# Patient Record
Sex: Male | Born: 1960 | Race: Black or African American | Hispanic: No | Marital: Single | Smoking: Never smoker
Health system: Southern US, Community
[De-identification: ages and names within clinical notes are randomized; demographics above are authoritative.]

## PROBLEM LIST (undated history)

## (undated) DIAGNOSIS — I10 Essential (primary) hypertension: Secondary | ICD-10-CM

## (undated) DIAGNOSIS — M109 Gout, unspecified: Secondary | ICD-10-CM

## (undated) DIAGNOSIS — E785 Hyperlipidemia, unspecified: Secondary | ICD-10-CM

## (undated) DIAGNOSIS — E119 Type 2 diabetes mellitus without complications: Secondary | ICD-10-CM

## (undated) HISTORY — PX: NO PAST SURGERIES: SHX2092

---

## 2016-03-12 ENCOUNTER — Emergency Department: Payer: Medicare Other

## 2016-03-12 ENCOUNTER — Encounter: Payer: Self-pay | Admitting: Emergency Medicine

## 2016-03-12 ENCOUNTER — Inpatient Hospital Stay
Admission: EM | Admit: 2016-03-12 | Discharge: 2016-03-16 | DRG: 683 | Disposition: A | Discharge status: Home Health | Payer: Medicare Other | Attending: Internal Medicine | Admitting: Internal Medicine

## 2016-03-12 DIAGNOSIS — R809 Proteinuria, unspecified: Secondary | ICD-10-CM | POA: Diagnosis present

## 2016-03-12 DIAGNOSIS — E860 Dehydration: Secondary | ICD-10-CM | POA: Diagnosis present

## 2016-03-12 DIAGNOSIS — F209 Schizophrenia, unspecified: Secondary | ICD-10-CM | POA: Diagnosis present

## 2016-03-12 DIAGNOSIS — E118 Type 2 diabetes mellitus with unspecified complications: Secondary | ICD-10-CM

## 2016-03-12 DIAGNOSIS — E872 Acidosis: Secondary | ICD-10-CM | POA: Diagnosis present

## 2016-03-12 DIAGNOSIS — Z79899 Other long term (current) drug therapy: Secondary | ICD-10-CM

## 2016-03-12 DIAGNOSIS — E875 Hyperkalemia: Secondary | ICD-10-CM | POA: Diagnosis present

## 2016-03-12 DIAGNOSIS — I10 Essential (primary) hypertension: Secondary | ICD-10-CM | POA: Diagnosis present

## 2016-03-12 DIAGNOSIS — Z6835 Body mass index (BMI) 35.0-35.9, adult: Secondary | ICD-10-CM | POA: Diagnosis not present

## 2016-03-12 DIAGNOSIS — Z8249 Family history of ischemic heart disease and other diseases of the circulatory system: Secondary | ICD-10-CM

## 2016-03-12 DIAGNOSIS — D72829 Elevated white blood cell count, unspecified: Secondary | ICD-10-CM | POA: Diagnosis present

## 2016-03-12 DIAGNOSIS — E119 Type 2 diabetes mellitus without complications: Secondary | ICD-10-CM | POA: Diagnosis not present

## 2016-03-12 DIAGNOSIS — R319 Hematuria, unspecified: Secondary | ICD-10-CM | POA: Diagnosis present

## 2016-03-12 DIAGNOSIS — N179 Acute kidney failure, unspecified: Secondary | ICD-10-CM | POA: Diagnosis not present

## 2016-03-12 DIAGNOSIS — R778 Other specified abnormalities of plasma proteins: Secondary | ICD-10-CM | POA: Diagnosis present

## 2016-03-12 DIAGNOSIS — D472 Monoclonal gammopathy: Secondary | ICD-10-CM | POA: Diagnosis present

## 2016-03-12 DIAGNOSIS — R7989 Other specified abnormal findings of blood chemistry: Secondary | ICD-10-CM

## 2016-03-12 DIAGNOSIS — Z841 Family history of disorders of kidney and ureter: Secondary | ICD-10-CM | POA: Diagnosis not present

## 2016-03-12 DIAGNOSIS — M25569 Pain in unspecified knee: Secondary | ICD-10-CM

## 2016-03-12 DIAGNOSIS — E1140 Type 2 diabetes mellitus with diabetic neuropathy, unspecified: Secondary | ICD-10-CM | POA: Diagnosis present

## 2016-03-12 DIAGNOSIS — M25561 Pain in right knee: Secondary | ICD-10-CM | POA: Diagnosis present

## 2016-03-12 DIAGNOSIS — M109 Gout, unspecified: Secondary | ICD-10-CM | POA: Diagnosis present

## 2016-03-12 DIAGNOSIS — R000 Tachycardia, unspecified: Secondary | ICD-10-CM | POA: Diagnosis present

## 2016-03-12 DIAGNOSIS — Z833 Family history of diabetes mellitus: Secondary | ICD-10-CM

## 2016-03-12 DIAGNOSIS — N19 Unspecified kidney failure: Secondary | ICD-10-CM | POA: Diagnosis not present

## 2016-03-12 DIAGNOSIS — R81 Glycosuria: Secondary | ICD-10-CM | POA: Diagnosis present

## 2016-03-12 DIAGNOSIS — E785 Hyperlipidemia, unspecified: Secondary | ICD-10-CM | POA: Diagnosis present

## 2016-03-12 DIAGNOSIS — Z794 Long term (current) use of insulin: Secondary | ICD-10-CM | POA: Diagnosis not present

## 2016-03-12 DIAGNOSIS — R112 Nausea with vomiting, unspecified: Secondary | ICD-10-CM

## 2016-03-12 HISTORY — DX: Hyperlipidemia, unspecified: E78.5

## 2016-03-12 HISTORY — DX: Essential (primary) hypertension: I10

## 2016-03-12 HISTORY — DX: Morbid (severe) obesity due to excess calories: E66.01

## 2016-03-12 HISTORY — DX: Gout, unspecified: M10.9

## 2016-03-12 HISTORY — DX: Type 2 diabetes mellitus without complications: E11.9

## 2016-03-12 LAB — CBC
HEMATOCRIT: 38 % — AB (ref 40.0–52.0)
Hemoglobin: 12 g/dL — ABNORMAL LOW (ref 13.0–18.0)
MCH: 25.3 pg — AB (ref 26.0–34.0)
MCHC: 31.6 g/dL — ABNORMAL LOW (ref 32.0–36.0)
MCV: 80.1 fL (ref 80.0–100.0)
Platelets: 281 10*3/uL (ref 150–440)
RBC: 4.74 MIL/uL (ref 4.40–5.90)
RDW: 13.8 % (ref 11.5–14.5)
WBC: 16.4 10*3/uL — AB (ref 3.8–10.6)

## 2016-03-12 LAB — COMPREHENSIVE METABOLIC PANEL
ALT: 31 U/L (ref 17–63)
AST: 35 U/L (ref 15–41)
Albumin: 3.7 g/dL (ref 3.5–5.0)
Alkaline Phosphatase: 64 U/L (ref 38–126)
Anion gap: 15 (ref 5–15)
BUN: 84 mg/dL — AB (ref 6–20)
CHLORIDE: 106 mmol/L (ref 101–111)
CO2: 18 mmol/L — AB (ref 22–32)
Calcium: 10.1 mg/dL (ref 8.9–10.3)
Creatinine, Ser: 5.01 mg/dL — ABNORMAL HIGH (ref 0.61–1.24)
GFR calc Af Amer: 14 mL/min — ABNORMAL LOW (ref >60)
GFR, EST NON AFRICAN AMERICAN: 12 mL/min — AB (ref >60)
Glucose, Bld: 200 mg/dL — ABNORMAL HIGH (ref 65–99)
POTASSIUM: 5.3 mmol/L — AB (ref 3.5–5.1)
SODIUM: 139 mmol/L (ref 135–145)
Total Bilirubin: 0.8 mg/dL (ref 0.3–1.2)
Total Protein: 8.7 g/dL — ABNORMAL HIGH (ref 6.5–8.1)

## 2016-03-12 LAB — URINALYSIS COMPLETE WITH MICROSCOPIC (ARMC ONLY)
Bilirubin Urine: NEGATIVE
Glucose, UA: 50 mg/dL — AB
LEUKOCYTES UA: NEGATIVE
Nitrite: NEGATIVE
PH: 5 (ref 5.0–8.0)
Specific Gravity, Urine: 1.01 (ref 1.005–1.030)

## 2016-03-12 LAB — LIPASE, BLOOD: LIPASE: 21 U/L (ref 11–51)

## 2016-03-12 LAB — TROPONIN I: TROPONIN I: 0.03 ng/mL (ref <0.031)

## 2016-03-12 LAB — BRAIN NATRIURETIC PEPTIDE: B Natriuretic Peptide: 54 pg/mL (ref 0.0–100.0)

## 2016-03-12 MED ORDER — ONDANSETRON HCL 4 MG/2ML IJ SOLN
4.0000 mg | Freq: Once | INTRAMUSCULAR | Status: AC
  Administered 2016-03-12: 4 mg via INTRAVENOUS
  Filled 2016-03-12: qty 2

## 2016-03-12 MED ORDER — SODIUM CHLORIDE 0.9 % IV BOLUS (SEPSIS)
1000.0000 mL | Freq: Once | INTRAVENOUS | Status: AC
  Administered 2016-03-12: 1000 mL via INTRAVENOUS

## 2016-03-12 MED ORDER — SODIUM CHLORIDE 0.9 % IV SOLN
INTRAVENOUS | Status: DC
  Administered 2016-03-13 – 2016-03-16 (×6): via INTRAVENOUS

## 2016-03-12 MED ORDER — SODIUM CHLORIDE 0.9 % IV SOLN: 1000.0000 mL | INTRAVENOUS | Status: DC

## 2016-03-12 MED ORDER — SODIUM CHLORIDE 0.9 % IV SOLN
1000.0000 mL | Freq: Once | INTRAVENOUS | Status: DC
Start: 2016-03-12 — End: 2016-03-12

## 2016-03-12 MED ORDER — SODIUM CHLORIDE 0.9 % IV SOLN: 1000.0000 mL | Freq: Once | INTRAVENOUS | Status: DC

## 2016-03-12 MED ORDER — DEXTROSE 5 % IV SOLN
1.0000 g | INTRAVENOUS | Status: DC
  Administered 2016-03-12 – 2016-03-13 (×2): 1 g via INTRAVENOUS
  Filled 2016-03-12 (×3): qty 10

## 2016-03-12 MED ORDER — INSULIN ASPART 100 UNIT/ML ~~LOC~~ SOLN
0.0000 [IU] | Freq: Three times a day (TID) | SUBCUTANEOUS | Status: DC
  Administered 2016-03-13 – 2016-03-14 (×4): 2 [IU] via SUBCUTANEOUS
  Administered 2016-03-14: 3 [IU] via SUBCUTANEOUS
  Administered 2016-03-15: 1 [IU] via SUBCUTANEOUS
  Administered 2016-03-15: 2 [IU] via SUBCUTANEOUS
  Administered 2016-03-15: 3 [IU] via SUBCUTANEOUS
  Filled 2016-03-12: qty 3
  Filled 2016-03-12: qty 1
  Filled 2016-03-12 (×4): qty 2
  Filled 2016-03-12: qty 3
  Filled 2016-03-12: qty 2

## 2016-03-12 MED ORDER — CLONIDINE HCL 0.1 MG PO TABS
0.2000 mg | ORAL_TABLET | Freq: Three times a day (TID) | ORAL | Status: DC
  Administered 2016-03-12 – 2016-03-16 (×11): 0.2 mg via ORAL
  Filled 2016-03-12 (×11): qty 2

## 2016-03-12 NOTE — ED Notes (Signed)
Pt arrived via EMS from home c/o N/V since this morning as well as right knee pain. Pt was seen at [facility] 3 days ago for foot pain and told he had diabetic neuropathy and given a prescription for gabapentin which he did not have filled because he could not afford it. Pt is also c/o of lower back pain today. Reports that lower back pain started after he slipped and fell Friday morning. Was seen at [facility] after falling.

## 2016-03-12 NOTE — ED Notes (Signed)
Patient transported to X-ray 

## 2016-03-12 NOTE — ED Notes (Signed)
Patient will be getting 3 bolus bags of NS and the only IV he has is 22G RAC, which is positional and has poor flow.  Patient states they have had to use [place] on him in the past and I will attempt [place] IV placement when he returns from XR.

## 2016-03-12 NOTE — H&P (Signed)
History and Physical    Caleb Hughes [id number] DOB: [birth year] DOA: 03/12/2016  Referring physician: Dr. Marcelene Butte PCP: [place]  Specialists: none  Chief Complaint: N/V  HPI: Caleb Hughes is a 55 y.o. male has a past medical history significant for DM, HTN, and obesity now ith 2-3 day hx of fever with intractable N/V. Brought to the ER where he was noted to be in ARF with dehydration. Troponin mildly elevated. No CP or SOB. No diarrhea. He is now admitted.  Review of Systems: The patient denies  weight loss,, vision loss, decreased hearing, hoarseness, chest pain, syncope, dyspnea on exertion, peripheral edema, balance deficits, hemoptysis, abdominal pain, melena, hematochezia, severe indigestion/heartburn, hematuria, incontinence, genital sores, muscle weakness, suspicious skin lesions, transient blindness, difficulty walking, depression, unusual weight change, abnormal bleeding, enlarged lymph nodes, angioedema, and breast masses.   Past Medical History  Diagnosis Date  . Diabetes mellitus without complication ([place])   . Hypertension   . Hyperlipemia    History reviewed. No pertinent past surgical history. Social History:  reports that he has never smoked. He does not have any smokeless tobacco history on file. He reports that he does not drink alcohol. His drug history is not on file.  No Known Allergies  Family History  Problem Relation Age of Onset  . Hypertension Mother   . Diabetes type II Mother   . CAD Father     Prior to Admission medications   Not on File   Physical Exam: Filed Vitals:   03/12/16 2009 03/12/16 2030  BP: 156/100 168/99  Pulse: 122 125  Temp: 99.6 F (37.6 C)   TempSrc: Oral   Resp: 19 22  Height: 6\' 2"  (1.88 m)   Weight: 145.151 kg (320 lb)   SpO2: 94% 96%     General:  No apparent distress, WDWN, [place]/AT  Eyes: PERRL, EOMI, no scleral icterus, conjunctiva clear  ENT: dry oropharynx without lesions or exudate, TM's  benign, dentition fair  Neck: supple, no lymphadenopathy. No bruits or thyromegaly  Cardiovascular: rapid rate with regular rhythm without MRG; 2+ peripheral pulses, no JVD, 1+ peripheral edema  Respiratory: CTA biL, good air movement without wheezing, rhonchi or crackled, respiratory effort normal  Abdomen: soft, non tender to palpation, positive bowel sounds, no guarding, no rebound  Skin: no rashes or lesions  Musculoskeletal: normal bulk and tone, no joint swelling  Psychiatric: normal mood and affect, A&OX3  Neurologic: CN 2-12 grossly intact, Motor strength 5/5 in all 4 groups with non-focal sensory exam and symmetric DTR's  Labs on Admission:  Basic Metabolic Panel:  Recent Labs Lab 03/12/16 2015  NA 139  K 5.3*  CL 106  CO2 18*  GLUCOSE 200*  BUN 84*  CREATININE 5.01*  CALCIUM 10.1   Liver Function Tests:  Recent Labs Lab 03/12/16 2015  AST 35  ALT 31  ALKPHOS 64  BILITOT 0.8  PROT 8.7*  ALBUMIN 3.7    Recent Labs Lab 03/12/16 2015  LIPASE 21   No results for input(s): AMMONIA in the last 168 hours. CBC:  Recent Labs Lab 03/12/16 2015  WBC 16.4*  HGB 12.0*  HCT 38.0*  MCV 80.1  PLT 281   Cardiac Enzymes:  Recent Labs Lab 03/12/16 2057  TROPONINI 0.03    BNP (last 3 results)  Recent Labs  03/12/16 2057  BNP 54.0    ProBNP (last 3 results) No results for input(s): PROBNP in the last 8760 hours.  CBG: No results for  input(s): GLUCAP in the last 168 hours.  Radiological Exams on Admission: No results found.  EKG: Independently reviewed.  Assessment/Plan Principal Problem:   ARF (acute renal failure) (HCC) Active Problems:   Dehydration   Intractable nausea and vomiting   Type II diabetes mellitus with manifestations ([place])   Will admit to floor with IV fluids. Consult Nephrology and Cardiology. Telemetry. Empiric IV ABX. Monitor sugars and renal fxn closely. IV Zofran as needed.  Diet: clear liquids Fluids:  NS@100  DVT Prophylaxis: SQ Heparin  Code Status: FULL  Family Communication: yes  Disposition Plan: home  Time spent: 50 min

## 2016-03-12 NOTE — ED Provider Notes (Signed)
Time Seen: Approximately 2100  I have reviewed the triage notes  Chief Complaint: Nausea; Emesis; and Knee Pain   History of Present Illness: Caleb Hughes is a 55 y.o. male who states he's been seen at [facility] recently was diagnosed with peripheral neuropathy. Patient states today he's noticed some increase nausea and has vomited multiple times at home. The patient was not aware of a fever does have some persistent low back pain. He states the pain started after he slipped and fell on Friday morning. He was seen at [facility] also on that occasion his evaluation was negative. Patient denies any loose stool or diarrhea though is felt lightheaded and still feels nauseated. He denies any hematemesis or biliary emesis. He does have a history of type 2 diabetes with insulin therapy states his blood sugars have been running slightly high at home. He did have some brief episode of left-sided chest discomfort after vomiting. He denies any persistent shortness of breath. He states he has been urinating on a frequent basis especially over the last 3 days.   Past Medical History  Diagnosis Date  . Diabetes mellitus without complication ([place])   . Hypertension   . Hyperlipemia     Patient Active Problem List   Diagnosis Date Noted  . ARF (acute renal failure) ([place]) 03/12/2016  . Dehydration 03/12/2016  . Intractable nausea and vomiting 03/12/2016  . Type II diabetes mellitus with manifestations ([place]) 03/12/2016    History reviewed. No pertinent past surgical history.  History reviewed. No pertinent past surgical history.  Current Outpatient Rx  Name  Route  Sig  Dispense  Refill  . amLODipine (NORVASC) 10 MG tablet   Oral   Take 1 tablet by mouth daily.         . chlorthalidone (HYGROTON) 25 MG tablet   Oral   Take 1 tablet by mouth 2 (two) times daily.         . cloNIDine (CATAPRES) 0.2 MG tablet   Oral   Take 1 tablet by mouth 3 (three) times daily.         Marland Kitchen COLCRYS 0.6 MG tablet    Oral   Take 1 tablet by mouth daily.           Dispense as written.   . furosemide (LASIX) 20 MG tablet   Oral   Take 1 tablet by mouth daily.         Marland Kitchen lisinopril (PRINIVIL,ZESTRIL) 10 MG tablet   Oral   Take 1 tablet by mouth daily.         Marland Kitchen NOVOLOG MIX 70/30 (70-30) 100 UNIT/ML injection   Subcutaneous   Inject 70 Units into the skin daily with breakfast.            Dispense as written.   . simvastatin (ZOCOR) 20 MG tablet   Oral   Take 1 tablet by mouth daily.           Allergies:  Review of patient's allergies indicates no known allergies.  Family History: Family History  Problem Relation Age of Onset  . Hypertension Mother   . Diabetes type II Mother   . CAD Father     Social History: Social History  Substance Use Topics  . Smoking status: Never Smoker   . Smokeless tobacco: None  . Alcohol Use: No     Review of Systems:   10 point review of systems was performed and was otherwise negative:  Constitutional: No fever Eyes: No  visual disturbances ENT: No sore throat, ear pain Cardiac: No chest pain Respiratory: No shortness of breath, wheezing, or stridor Abdomen: No abdominal pain, no vomiting, No diarrhea Endocrine: No weight loss, No night sweats Extremities: No peripheral edema, cyanosis Skin: No rashes, easy bruising Neurologic: No focal weakness, trouble with speech or swollowing Urologic: No dysuria, Hematuria,  Pain is located in both lower extremities primarily from the knee distal  Physical Exam:  ED Triage Vitals  Enc Vitals Group     BP 03/12/16 2009 156/100 mmHg     Pulse Rate 03/12/16 2009 122     Resp 03/12/16 2009 19     Temp 03/12/16 2009 99.6 F (37.6 C)     Temp Source 03/12/16 2009 Oral     SpO2 03/12/16 2009 94 %     Weight 03/12/16 2009 320 lb (145.151 kg)     Height 03/12/16 2009 6\' 2"  (1.88 m)     Head Cir --      Peak Flow --      Pain Score 03/12/16 2011 10     Pain Loc --      Pain Edu? --       Excl. in [place]? --     General: Awake , Alert , and Oriented times 3; GCS 15 Head: Normal cephalic , atraumatic Eyes: Pupils equal , round, reactive to light Nose/Throat: No nasal drainage, patent upper airway without erythema or exudate. Dry mucous membranes without ketotic breath Neck: Supple, Full range of motion, No anterior adenopathy or palpable thyroid masses Lungs: Clear to ascultation without wheezes , rhonchi, or rales Heart: Regular rate, regular rhythm without murmurs , gallops , or rubs Abdomen: Soft, non tender without rebound, guarding , or rigidity; bowel sounds positive and symmetric in all 4 quadrants. No organomegaly .        Extremities: Diffuse pain across both lower extremities. No obvious musculoskeletal abnormalities. Neurologic: normal ambulation, Motor symmetric without deficits, sensory intact Skin: warm, dry, no rashes   Labs:   All laboratory work was reviewed including any pertinent negatives or positives listed below:  Labs Reviewed  COMPREHENSIVE METABOLIC PANEL - Abnormal; Notable for the following:    Potassium 5.3 (*)    CO2 18 (*)    Glucose, Bld 200 (*)    BUN 84 (*)    Creatinine, Ser 5.01 (*)    Total Protein 8.7 (*)    GFR calc non Af Amer 12 (*)    GFR calc Af Amer 14 (*)    All other components within normal limits  CBC - Abnormal; Notable for the following:    WBC 16.4 (*)    Hemoglobin 12.0 (*)    HCT 38.0 (*)    MCH 25.3 (*)    MCHC 31.6 (*)    All other components within normal limits  URINALYSIS COMPLETEWITH MICROSCOPIC ([facility] ONLY) - Abnormal; Notable for the following:    Color, Urine YELLOW (*)    APPearance CLEAR (*)    Glucose, UA 50 (*)    Ketones, ur TRACE (*)    Hgb urine dipstick 3+ (*)    Protein, ur >500 (*)    Bacteria, UA RARE (*)    Squamous Epithelial / LPF 0-5 (*)    All other components within normal limits  LIPASE, BLOOD  BRAIN NATRIURETIC PEPTIDE  TROPONIN I  Patient appears to have renal failure based on  his BUN and creatinine levels.  EKG: ED ECG REPORT I, Denice Bors  Marcelene Butte, the attending physician, personally viewed and interpreted this ECG.  Date: 03/12/2016 EKG Time: 2110 Rate: 123 Rhythm: Sinus tachycardia QRS Axis: normal Intervals: normal ST/T Wave abnormalities: normal Conduction Disturbances: none Narrative Interpretation: unremarkable No acute ischemic changes   Radiology:   DG Chest 2 View (Final result) Result time: 03/12/16 22:47:30   Final result by Rad Results In Interface (03/12/16 22:47:30)   Narrative:   CLINICAL DATA: 56 year old male with left-sided chest pain  EXAM: CHEST 2 VIEW  COMPARISON: None.  FINDINGS: The heart size and mediastinal contours are within normal limits. Both lungs are clear. The visualized skeletal structures are unremarkable.  IMPRESSION: No active cardiopulmonary disease.   Electronically Signed By: Anner Crete M.D. On: 03/12/2016 22:47       I personally reviewed the radiologic studies    ED Course: Patient was started on IV fluid resuscitation especially after return of his laboratory findings which show large concern on his kidney function. I asked the patient again whether or not he had any history of renal disease and he states he is not aware of any history of kidney problems. This may be prerenal in nature complicated by his diabetes. Patient had brief left-sided chest discomfort with no ischemic changes on his EKG and thus far troponin is negative. His urine shows some occasional ketones those blood glucose is at 200 and he does not have an anion gap at this point. His bicarbonate is only slightly low at 18 and I felt was unlikely had diabetic ketoacidosis at this time. Treatment will be the same and as fluid resuscitation. Patient's case was reviewed with the hospitalist team, further disposition and management depends upon their evaluation    Assessment:  Renal failure likely  prerenal Insulin-dependent diabetes Peripheral neuropathy Acute unspecified chest pain Persistent nausea and vomiting  Final Clinical Impression:   Final diagnoses:  Renal failure     Plan:  Inpatient management            Daymon Larsen, MD 03/12/16 2350

## 2016-03-13 ENCOUNTER — Inpatient Hospital Stay: Payer: Medicare Other

## 2016-03-13 ENCOUNTER — Inpatient Hospital Stay (HOSPITAL_COMMUNITY)
Admit: 2016-03-13 | Discharge: 2016-03-13 | Disposition: A | Discharge status: Home / Self Care | Payer: Medicare Other | Attending: Nurse Practitioner | Admitting: Nurse Practitioner

## 2016-03-13 DIAGNOSIS — E785 Hyperlipidemia, unspecified: Secondary | ICD-10-CM | POA: Diagnosis present

## 2016-03-13 DIAGNOSIS — E860 Dehydration: Secondary | ICD-10-CM

## 2016-03-13 DIAGNOSIS — I10 Essential (primary) hypertension: Secondary | ICD-10-CM | POA: Diagnosis present

## 2016-03-13 DIAGNOSIS — M109 Gout, unspecified: Secondary | ICD-10-CM | POA: Diagnosis present

## 2016-03-13 DIAGNOSIS — R7989 Other specified abnormal findings of blood chemistry: Secondary | ICD-10-CM

## 2016-03-13 DIAGNOSIS — R778 Other specified abnormalities of plasma proteins: Secondary | ICD-10-CM

## 2016-03-13 DIAGNOSIS — E875 Hyperkalemia: Secondary | ICD-10-CM

## 2016-03-13 LAB — CBC
HEMATOCRIT: 34.2 % — AB (ref 40.0–52.0)
Hemoglobin: 11.1 g/dL — ABNORMAL LOW (ref 13.0–18.0)
MCH: 25.7 pg — ABNORMAL LOW (ref 26.0–34.0)
MCHC: 32.4 g/dL (ref 32.0–36.0)
MCV: 79.4 fL — ABNORMAL LOW (ref 80.0–100.0)
PLATELETS: 256 10*3/uL (ref 150–440)
RBC: 4.31 MIL/uL — ABNORMAL LOW (ref 4.40–5.90)
RDW: 13.6 % (ref 11.5–14.5)
WBC: 13.7 10*3/uL — AB (ref 3.8–10.6)

## 2016-03-13 LAB — PROTEIN / CREATININE RATIO, URINE
CREATININE, URINE: 88 mg/dL
Protein Creatinine Ratio: 3.55 mg/mg{Cre} — ABNORMAL HIGH (ref 0.00–0.15)
Total Protein, Urine: 312 mg/dL

## 2016-03-13 LAB — TSH: TSH: 0.486 u[IU]/mL (ref 0.350–4.500)

## 2016-03-13 LAB — COMPREHENSIVE METABOLIC PANEL
ALBUMIN: 3.2 g/dL — AB (ref 3.5–5.0)
ALK PHOS: 55 U/L (ref 38–126)
ALT: 26 U/L (ref 17–63)
AST: 27 U/L (ref 15–41)
Anion gap: 11 (ref 5–15)
BILIRUBIN TOTAL: 0.5 mg/dL (ref 0.3–1.2)
BUN: 78 mg/dL — AB (ref 6–20)
CALCIUM: 9.4 mg/dL (ref 8.9–10.3)
CO2: 20 mmol/L — ABNORMAL LOW (ref 22–32)
CREATININE: 4.77 mg/dL — AB (ref 0.61–1.24)
Chloride: 109 mmol/L (ref 101–111)
GFR calc Af Amer: 14 mL/min — ABNORMAL LOW (ref >60)
GFR, EST NON AFRICAN AMERICAN: 12 mL/min — AB (ref >60)
GLUCOSE: 203 mg/dL — AB (ref 65–99)
POTASSIUM: 4.9 mmol/L (ref 3.5–5.1)
Sodium: 140 mmol/L (ref 135–145)
TOTAL PROTEIN: 7.7 g/dL (ref 6.5–8.1)

## 2016-03-13 LAB — TROPONIN I
Troponin I: 0.04 ng/mL — ABNORMAL HIGH (ref <0.031)
Troponin I: 0.03 ng/mL (ref <0.031)
Troponin I: 0.03 ng/mL (ref <0.031)

## 2016-03-13 LAB — ECHOCARDIOGRAM COMPLETE
Height: 74 in
Weight: 4472 oz

## 2016-03-13 LAB — GLUCOSE, CAPILLARY
GLUCOSE-CAPILLARY: 177 mg/dL — AB (ref 65–99)
GLUCOSE-CAPILLARY: 199 mg/dL — AB (ref 65–99)
GLUCOSE-CAPILLARY: 251 mg/dL — AB (ref 65–99)
Glucose-Capillary: 114 mg/dL — ABNORMAL HIGH (ref 65–99)

## 2016-03-13 LAB — PHOSPHORUS: Phosphorus: 4.3 mg/dL (ref 2.5–4.6)

## 2016-03-13 LAB — URIC ACID: Uric Acid, Serum: 14.9 mg/dL — ABNORMAL HIGH (ref 4.4–7.6)

## 2016-03-13 LAB — CK: CK TOTAL: 994 U/L — AB (ref 49–397)

## 2016-03-13 MED ORDER — ONDANSETRON HCL 4 MG PO TABS: 4.0000 mg | ORAL_TABLET | Freq: Four times a day (QID) | ORAL | Status: DC | PRN

## 2016-03-13 MED ORDER — ASPIRIN EC 81 MG PO TBEC
81.0000 mg | DELAYED_RELEASE_TABLET | Freq: Every day | ORAL | Status: DC
  Administered 2016-03-13 – 2016-03-16 (×4): 81 mg via ORAL
  Filled 2016-03-13 (×4): qty 1

## 2016-03-13 MED ORDER — HEPARIN SODIUM (PORCINE) 5000 UNIT/ML IJ SOLN
5000.0000 [IU] | Freq: Three times a day (TID) | INTRAMUSCULAR | Status: DC
  Administered 2016-03-13 – 2016-03-16 (×10): 5000 [IU] via SUBCUTANEOUS
  Filled 2016-03-13 (×12): qty 1

## 2016-03-13 MED ORDER — ACETAMINOPHEN 650 MG RE SUPP: 650.0000 mg | Freq: Four times a day (QID) | RECTAL | Status: DC | PRN

## 2016-03-13 MED ORDER — ACETAMINOPHEN 325 MG PO TABS: 650.0000 mg | ORAL_TABLET | Freq: Four times a day (QID) | ORAL | Status: DC | PRN

## 2016-03-13 MED ORDER — SODIUM CHLORIDE 0.9% FLUSH
3.0000 mL | Freq: Two times a day (BID) | INTRAVENOUS | Status: DC
  Administered 2016-03-13 – 2016-03-14 (×3): 3 mL via INTRAVENOUS

## 2016-03-13 MED ORDER — SIMVASTATIN 20 MG PO TABS
20.0000 mg | ORAL_TABLET | Freq: Every day | ORAL | Status: DC
Start: 2016-03-13 — End: 2016-03-14
  Administered 2016-03-13 – 2016-03-14 (×2): 20 mg via ORAL
  Filled 2016-03-13 (×2): qty 1

## 2016-03-13 MED ORDER — AMLODIPINE BESYLATE 10 MG PO TABS
10.0000 mg | ORAL_TABLET | Freq: Every day | ORAL | Status: DC
  Administered 2016-03-13 – 2016-03-16 (×4): 10 mg via ORAL
  Filled 2016-03-13 (×4): qty 1

## 2016-03-13 MED ORDER — HYDROCODONE-ACETAMINOPHEN 5-325 MG PO TABS
1.0000 | ORAL_TABLET | ORAL | Status: DC | PRN
  Administered 2016-03-13: 1 via ORAL
  Administered 2016-03-13 – 2016-03-14 (×2): 2 via ORAL
  Filled 2016-03-13: qty 2
  Filled 2016-03-13: qty 1
  Filled 2016-03-13: qty 2

## 2016-03-13 MED ORDER — PANTOPRAZOLE SODIUM 40 MG IV SOLR
40.0000 mg | Freq: Two times a day (BID) | INTRAVENOUS | Status: DC
  Administered 2016-03-13 – 2016-03-14 (×4): 40 mg via INTRAVENOUS
  Filled 2016-03-13 (×4): qty 40

## 2016-03-13 MED ORDER — ONDANSETRON HCL 4 MG/2ML IJ SOLN
4.0000 mg | Freq: Four times a day (QID) | INTRAMUSCULAR | Status: DC | PRN
  Administered 2016-03-13: 4 mg via INTRAVENOUS
  Filled 2016-03-13 (×2): qty 2

## 2016-03-13 MED ORDER — DOCUSATE SODIUM 100 MG PO CAPS
100.0000 mg | ORAL_CAPSULE | Freq: Two times a day (BID) | ORAL | Status: DC
  Administered 2016-03-13 – 2016-03-15 (×7): 100 mg via ORAL
  Filled 2016-03-13 (×8): qty 1

## 2016-03-13 MED ORDER — METHYLPREDNISOLONE SODIUM SUCC 125 MG IJ SOLR
60.0000 mg | Freq: Once | INTRAMUSCULAR | Status: AC
  Administered 2016-03-13: 60 mg via INTRAVENOUS
  Filled 2016-03-13: qty 2

## 2016-03-13 MED ORDER — SODIUM BICARBONATE 650 MG PO TABS
1300.0000 mg | ORAL_TABLET | Freq: Two times a day (BID) | ORAL | Status: DC
  Administered 2016-03-13 – 2016-03-16 (×7): 1300 mg via ORAL
  Filled 2016-03-13 (×7): qty 2

## 2016-03-13 MED ORDER — LABETALOL HCL 5 MG/ML IV SOLN
20.0000 mg | Freq: Once | INTRAVENOUS | Status: AC
  Administered 2016-03-13: 20 mg via INTRAVENOUS
  Filled 2016-03-13: qty 4

## 2016-03-13 MED ORDER — BISACODYL 10 MG RE SUPP: 10.0000 mg | Freq: Every day | RECTAL | Status: DC | PRN

## 2016-03-13 MED ORDER — LISINOPRIL 10 MG PO TABS
10.0000 mg | ORAL_TABLET | Freq: Every day | ORAL | Status: DC
  Filled 2016-03-13: qty 1

## 2016-03-13 NOTE — Consult Note (Signed)
Date: 03/13/2016                  Patient Name:  Caleb Hughes  MRN: [id number]  DOB: [birth year]  Age / Sex: 55 y.o., male         PCP: [facility]                 Service Requesting Consult: Internal medicine                  Reason for Consult: Acute renal failure             History of Present Illness: Patient is a 55 y.o. male with medical problems of Long-standing diabetes type 2 with neuropathy, hypertension, hyperlipidemia, obesity, schizophrenia, gout who was admitted to [facility] on 03/12/2016 for evaluation of generalized weakness, nausea and vomiting.   Patient reports that until about a few days ago, he was in his usual state of health. He is disabled since 2008 but able to care for himself and Lacinda Axon and clean around his house, sing in the church choir etc. he states that he fell on Friday. He was evaluated at [facility] and discharged home. All weekend, he did not feel well. He reported pain in his lower extremities and was prescribed gabapentin. The cost was $16. He did not pick it up. Yesterday morning, he had nausea and vomiting 1. He decided to come to the hospital for evaluation  Admission labs are significant for severe acute renal failure with creatinine of 5.01/BUN of 84. No prior records are available for comparison. We inquired with primary care but they do not have any recent creatinine levels. Patient reports he was taking ibuprofen at home for neuropathic pain but it did not help him. Nephrology consult has been requested for evaluation  Urinalysis from last night shows protein greater than 500, hemoglobin 3+ on dipstick, glucose 50. RBCs 0-5   Medications: Outpatient medications: Prescriptions prior to admission  Medication Sig Dispense Refill Last Dose  . amLODipine (NORVASC) 10 MG tablet Take 1 tablet by mouth daily.   03/11/2016 at Unknown time  . chlorthalidone (HYGROTON) 25 MG tablet Take 1 tablet by mouth 2 (two) times daily.   03/11/2016 at  Unknown time  . cloNIDine (CATAPRES) 0.2 MG tablet Take 1 tablet by mouth 3 (three) times daily.   03/11/2016 at Unknown time  . COLCRYS 0.6 MG tablet Take 1 tablet by mouth daily.   03/11/2016 at Unknown time  . furosemide (LASIX) 20 MG tablet Take 1 tablet by mouth daily.   03/11/2016 at Unknown time  . lisinopril (PRINIVIL,ZESTRIL) 10 MG tablet Take 1 tablet by mouth daily.   03/11/2016 at Unknown time  . NOVOLOG MIX 70/30 (70-30) 100 UNIT/ML injection Inject 70 Units into the skin daily with breakfast.    03/11/2016 at Unknown time  . simvastatin (ZOCOR) 20 MG tablet Take 1 tablet by mouth daily.   03/11/2016 at Unknown time    Current medications: Current Facility-Administered Medications  Medication Dose Route Frequency Provider Last Rate Last Dose  . 0.9 %  sodium chloride infusion   Intravenous Continuous Idelle Crouch, MD 100 mL/hr at 03/13/16 0219    . acetaminophen (TYLENOL) tablet 650 mg  650 mg Oral Q6H PRN Idelle Crouch, MD       Or  . acetaminophen (TYLENOL) suppository 650 mg  650 mg Rectal Q6H PRN Idelle Crouch, MD      . amLODipine (NORVASC) tablet  10 mg  10 mg Oral Daily Idelle Crouch, MD   10 mg at 03/13/16 [id number]  . aspirin EC tablet 81 mg  81 mg Oral Daily Idelle Crouch, MD   81 mg at 03/13/16 [id number]  . bisacodyl (DULCOLAX) suppository 10 mg  10 mg Rectal Daily PRN Idelle Crouch, MD      . cefTRIAXone (ROCEPHIN) 1 g in dextrose 5 % 50 mL IVPB  1 g Intravenous Q24H Idelle Crouch, MD   Stopped at 03/12/16 2331  . cloNIDine (CATAPRES) tablet 0.2 mg  0.2 mg Oral TID Idelle Crouch, MD   0.2 mg at 03/13/16 0916  . docusate sodium (COLACE) capsule 100 mg  100 mg Oral BID Idelle Crouch, MD   100 mg at 03/13/16 0916  . heparin injection 5,000 Units  5,000 Units Subcutaneous Q8H Idelle Crouch, MD   5,000 Units at 03/13/16 [phone]  . HYDROcodone-acetaminophen (NORCO/VICODIN) 5-325 MG per tablet 1-2 tablet  1-2 tablet Oral Q4H PRN Idelle Crouch, MD   1 tablet at  03/13/16 0133  . insulin aspart (novoLOG) injection 0-9 Units  0-9 Units Subcutaneous TID WC Idelle Crouch, MD   2 Units at 03/13/16 [phone]  . ondansetron (ZOFRAN) tablet 4 mg  4 mg Oral Q6H PRN Idelle Crouch, MD       Or  . ondansetron [facility]) injection 4 mg  4 mg Intravenous Q6H PRN Idelle Crouch, MD   4 mg at 03/13/16 0133  . pantoprazole (PROTONIX) injection 40 mg  40 mg Intravenous Q12H Idelle Crouch, MD   40 mg at 03/13/16 [id number]  . simvastatin (ZOCOR) tablet 20 mg  20 mg Oral Daily Idelle Crouch, MD   20 mg at 03/13/16 0917  . sodium chloride flush (NS) 0.9 % injection 3 mL  3 mL Intravenous Q12H Idelle Crouch, MD   3 mL at 03/13/16 [id number]      Allergies: No Known Allergies    Past Medical History: Past Medical History  Diagnosis Date  . Diabetes mellitus without complication ([place])   . Hypertension   . Hyperlipemia   . Morbid obesity ([place])   . Gout      Past Surgical History: History reviewed. No pertinent past surgical history.   Family History: Family History  Problem Relation Age of Onset  . Hypertension Mother   . Diabetes type II Mother   . CAD Father   . Hypertension Father   . Alcohol abuse Father   . Alcohol abuse Brother   . Alcohol abuse Brother   . Kidney failure Brother   . Kidney failure Brother      Social History: Social History   Social History  . Marital Status: Single    Spouse Name: N/A  . Number of Children: N/A  . Years of Education: N/A   Occupational History  . Not on file.   Social History Main Topics  . Smoking status: Never Smoker   . Smokeless tobacco: Not on file  . Alcohol Use: No  . Drug Use: No  . Sexual Activity: Not on file   Other Topics Concern  . Not on file   Social History Narrative   Lives in [place] with his son.  Disabled/doesn't work.  Does not routinely exercise.     Review of Systems: Gen: Denies weight loss, fevers or chills HEENT: Denies any vision issues or hearing problems CV:  No chest pain or angina. No  previous cardiac history, no leg edema Resp: Denies any acute shortness of breath GI: Denies any problem with appetite, no blood in the stool. Vomiting 1 GU : No problems with voiding. No history of kidney stones. No blood in the urine MS: Chronic back pain, neuropathic pain in the feet Derm:  Denies any acute problems Psych: History of schizophrenia Heme: Denies acute complaints Neuro: Reports neuropathy Endocrine. Long-term diabetic. A1c 6.5 in 09/2015 per chart.  Vital Signs: Blood pressure 131/78, pulse 100, temperature 98.7 F (37.1 C), temperature source Oral, resp. rate 18, height 6\' 2"  (1.88 m), weight 126.78 kg (279 lb 8 oz), SpO2 98 %.   Intake/Output Summary (Last 24 hours) at 03/13/16 1238 Last data filed at 03/13/16 1200  Gross per 24 hour  Intake   1160 ml  Output   2700 ml  Net  -1540 ml    Weight trends: [facility] Weights   03/12/16 2009 03/13/16 0318  Weight: 145.151 kg (320 lb) 126.78 kg (279 lb 8 oz)    Physical Exam: General:  Obese gentleman, laying in the bed   HEENT Anicteric, moist oral mucous membranes   Neck:  Supple   Lungs: Clear to auscultation bilaterally, room air, normal breathing effort   Heart::  Regular rhythm, no rub or gallop   Abdomen: Soft, nontender, nondistended   Extremities:  No peripheral edema   Neurologic: Alert, oriented   Skin: B/l toe onychomycosis             Lab results: Basic Metabolic Panel:  Recent Labs Lab 03/12/16 2015 03/13/16 0419  NA 139 140  K 5.3* 4.9  CL 106 109  CO2 18* 20*  GLUCOSE 200* 203*  BUN 84* 78*  CREATININE 5.01* 4.77*  CALCIUM 10.1 9.4    Liver Function Tests:  Recent Labs Lab 03/13/16 0419  AST 27  ALT 26  ALKPHOS 55  BILITOT 0.5  PROT 7.7  ALBUMIN 3.2*    Recent Labs Lab 03/12/16 2015  LIPASE 21   No results for input(s): AMMONIA in the last 168 hours.  CBC:  Recent Labs Lab 03/12/16 2015 03/13/16 0419  WBC 16.4* 13.7*  HGB  12.0* 11.1*  HCT 38.0* 34.2*  MCV 80.1 79.4*  PLT 281 256    Cardiac Enzymes:  Recent Labs Lab 03/13/16 0419  TROPONINI 0.04*    BNP: Invalid input(s): POCBNP  CBG:  Recent Labs Lab 03/13/16 0722 03/13/16 1137  GLUCAP 177* 114*    Microbiology: No results found for this or any previous visit (from the past 720 hour(s)).   Coagulation Studies: No results for input(s): LABPROT, INR in the last 72 hours.  Urinalysis:  Recent Labs  03/12/16 2057  COLORURINE YELLOW*  LABSPEC 1.010  PHURINE 5.0  GLUCOSEU 50*  HGBUR 3+*  BILIRUBINUR NEGATIVE  KETONESUR TRACE*  PROTEINUR >500*  NITRITE NEGATIVE  LEUKOCYTESUR NEGATIVE        Imaging: Dg Chest 2 View  03/12/2016  CLINICAL DATA:  55 year old male with left-sided chest pain EXAM: CHEST  2 VIEW COMPARISON:  None. FINDINGS: The heart size and mediastinal contours are within normal limits. Both lungs are clear. The visualized skeletal structures are unremarkable. IMPRESSION: No active cardiopulmonary disease. Electronically Signed   By: Anner Crete M.D.   On: 03/12/2016 22:47   Dg Knee 2 Views Right  03/13/2016  CLINICAL DATA:  Right knee pain. States he fell on Friday, but was having pain previous to fall. No surgeries. EXAM: RIGHT KNEE - 3  VIEW COMPARISON:  None. FINDINGS: There is no evidence of fracture, dislocation, or joint effusion. Traction spurs from the patella at the insertion of the quadriceps tendon, and from the tibial tuberosity. There is no evidence of arthropathy or other focal bone abnormality. Soft tissues are unremarkable. IMPRESSION: No acute abnormality. Electronically Signed   By: Lucrezia Europe M.D.   On: 03/13/2016 10:34      Assessment & Plan: Pt is a 55 y.o. yo male with a PMHX of medical problems of Long-standing diabetes type 2 with neuropathy, hypertension, hyperlipidemia, obesity, schizophrenia, gout , was admitted on 03/12/2016 with ARF.   1. Acute renal failure, with proteinuria,  hematuria, glucosuria Baseline creatinine is not known at this time Renal imaging is pending Urinalysis shows proteinuria, hematuria and glucosuria We will obtain a urine protein/creatinine ratio We will also obtain serologies Obtain CK levels Electrolytes and volume status are acceptable No acute indication for dialysis at present  2. Metabolic acidosis Likely secondary to renal failure Will add sodium bicarbonate to his regimen

## 2016-03-13 NOTE — Progress Notes (Signed)
*  PRELIMINARY RESULTS* Echocardiogram 2D Echocardiogram has been performed.  Caleb Hughes 03/13/2016, 2:52 PM

## 2016-03-13 NOTE — Progress Notes (Signed)
Patient ID: Caleb Hughes, male   DOB: [birth year], 55 y.o.   MRN: [id number] [facility] PROGRESS NOTE  Caleb Hughes [id number] DOB: May 20, 1961 DOA: 03/12/2016 PCP: Festus Aloe [facility]  HPI/Subjective: Patient's recently had a fall and went to the hospital in [place]. He was told he had diabetic neuropathy. He's been battling right knee pain going on for a few weeks now and has been taking colcryx and ibuprofen for gout. He still has right knee pain. He states he had a fever of 105 at home. I tried to clarify whether he had a fever of 100.5 and he said it was 105. Here in the hospital he has not that high of a temperature. He also had nausea vomiting and not feeling well.  Objective: Filed Vitals:   03/13/16 0318 03/13/16 0619  BP: 152/89 137/90  Pulse: 106 98  Temp: 98.9 F (37.2 C) 98.9 F (37.2 C)  Resp: 18 18    Filed Weights   03/12/16 2009 03/13/16 0318  Weight: 145.151 kg (320 lb) 126.78 kg (279 lb 8 oz)    ROS: Review of Systems  Constitutional: Negative for fever and chills.  Eyes: Negative for blurred vision.  Respiratory: Positive for shortness of breath. Negative for cough.   Cardiovascular: Negative for chest pain.  Gastrointestinal: Positive for nausea and vomiting. Negative for abdominal pain, diarrhea and constipation.  Genitourinary: Negative for dysuria.  Musculoskeletal: Positive for joint pain.  Neurological: Positive for weakness. Negative for dizziness and headaches.   Exam: Physical Exam  Constitutional: He is oriented to person, place, and time.  HENT:  Nose: No mucosal edema.  Mouth/Throat: No oropharyngeal exudate or posterior oropharyngeal edema.  Eyes: Conjunctivae, EOM and lids are normal. Pupils are equal, round, and reactive to light.  Neck: No JVD present. Carotid bruit is not present. No edema present. No thyroid mass and no thyromegaly present.  Cardiovascular: S1 normal and S2 normal.  Exam reveals no gallop.   No murmur  heard. Pulses:      Dorsalis pedis pulses are 2+ on the right side, and 2+ on the left side.  Respiratory: No respiratory distress. He has no wheezes. He has no rhonchi. He has no rales.  GI: Soft. Bowel sounds are normal. There is no tenderness.  Musculoskeletal:       Right knee: He exhibits decreased range of motion and swelling. Tenderness found. Patellar tendon tenderness noted.       Right ankle: He exhibits swelling.       Left ankle: He exhibits swelling.  Lymphadenopathy:    He has no cervical adenopathy.  Neurological: He is alert and oriented to person, place, and time. No cranial nerve deficit.  Skin: Skin is warm. No rash noted. Nails show no clubbing.  Right knee warm to the touch.  Psychiatric: He has a normal mood and affect.      Data Reviewed: Basic Metabolic Panel:  Recent Labs Lab 03/12/16 2015 03/13/16 0419  NA 139 140  K 5.3* 4.9  CL 106 109  CO2 18* 20*  GLUCOSE 200* 203*  BUN 84* 78*  CREATININE 5.01* 4.77*  CALCIUM 10.1 9.4   Liver Function Tests:  Recent Labs Lab 03/12/16 2015 03/13/16 0419  AST 35 27  ALT 31 26  ALKPHOS 64 55  BILITOT 0.8 0.5  PROT 8.7* 7.7  ALBUMIN 3.7 3.2*    Recent Labs Lab 03/12/16 2015  LIPASE 21   CBC:  Recent Labs Lab 03/12/16 2015 03/13/16 0419  WBC  16.4* 13.7*  HGB 12.0* 11.1*  HCT 38.0* 34.2*  MCV 80.1 79.4*  PLT 281 256   Cardiac Enzymes:  Recent Labs Lab 03/12/16 2057 03/13/16 0419  TROPONINI 0.03 0.04*   BNP (last 3 results)  Recent Labs  03/12/16 2057  BNP 54.0   CBG:  Recent Labs Lab 03/13/16 0722  GLUCAP 177*     Studies: Dg Chest 2 View  03/12/2016  CLINICAL DATA:  55 year old male with left-sided chest pain EXAM: CHEST  2 VIEW COMPARISON:  None. FINDINGS: The heart size and mediastinal contours are within normal limits. Both lungs are clear. The visualized skeletal structures are unremarkable. IMPRESSION: No active cardiopulmonary disease. Electronically Signed    By: Anner Crete M.D.   On: 03/12/2016 22:47    Scheduled Meds: . amLODipine  10 mg Oral Daily  . aspirin EC  81 mg Oral Daily  . cefTRIAXone (ROCEPHIN)  IV  1 g Intravenous Q24H  . cloNIDine  0.2 mg Oral TID  . docusate sodium  100 mg Oral BID  . heparin  5,000 Units Subcutaneous Q8H  . insulin aspart  0-9 Units Subcutaneous TID WC  . methylPREDNISolone (SOLU-MEDROL) injection  60 mg Intravenous Once  . pantoprazole (PROTONIX) IV  40 mg Intravenous Q12H  . simvastatin  20 mg Oral Daily  . sodium chloride flush  3 mL Intravenous Q12H   Continuous Infusions: . sodium chloride 100 mL/hr at 03/13/16 0219    Assessment/Plan:  1. Acute kidney injury. Unknown baseline kidney function. Continue IV fluid hydration. Hold nephrotoxic medication including ibuprofen, Lasix, high [place] and lisinopril. 2. Right knee pain and fever at home. Likely gout but with the fever are not sure. Patient empirically on Rocephin by admitting physician. I will give 1 dose of Solu-Medrol. Check a uric acid. Orthopedic consultation. Get an x-ray of the right knee. 3. Nausea vomiting. This has improved. Advance diet to full liquid and then maybe to solid food later on today. 4. Essential hypertension continue clonidine and Norvasc 5. Hyperlipidemia unspecified continue simvastatin 6. Type 2 diabetes without complication. Sugars will be high with the dose of steroid ear and put on sliding scale. 7. Slight troponin elevation likely false positive with acute renal failure. No chest pain or shortness of breath.  Code Status:     Code Status Orders        Start     Ordered   03/13/16 0312  Full code   Continuous     03/13/16 0312    Code Status History    Date Active Date Inactive Code Status Order ID Comments User Context   This patient has a current code status but no historical code status.     Disposition Plan: To be determined  Consultants:  Nephrology  Orthopedic  surgery  Antibiotics:  Rocephin  Time spent: 25 minutes  Loletha Grayer  [employer]

## 2016-03-13 NOTE — Progress Notes (Signed)
Dr. Estanislado Pandy notified of positive troponin no new orders at this time. Will continue to monitor pt.

## 2016-03-13 NOTE — ED Notes (Signed)
Hospitalist paged due to continuing tachycardia and hypertension after fluids. Order received for IV labetalol.

## 2016-03-13 NOTE — Progress Notes (Signed)
Reorder uric for lab, they had to redraw

## 2016-03-13 NOTE — Consult Note (Signed)
Cardiology Consult    Patient ID: Caleb Hughes MRN: [id number], DOB/AGE: Mar 02, 1961   Admit date: 03/12/2016 Date of Consult: 03/13/2016  Primary Physician: Robertson Primary Cardiologist: New Requesting Provider: R. Wieting, MD  Patient Profile    55 year old male with a history of hypertension, hyperlipidemia, diabetes, and obesity who was admitted with acute renal failure, dehydration, and elevated troponin.  Past Medical History   Past Medical History  Diagnosis Date  . Diabetes mellitus without complication ([place])   . Hypertension   . Hyperlipemia   . Morbid obesity ([place])   . Gout     History reviewed. No pertinent past surgical history.   Allergies  No Known Allergies  History of Present Illness    55 year old male with no prior cardiac history. He does have risk factors including diabetes, hypertension, hyperlipidemia, morbid obesity, and family history of coronary artery disease. He lives locally with his son and notes that he can complete usual activities without experiencing dyspnea on exertion. He has no prior history of chest pain. His diabetes is reasonably well controlled, with a hemoglobin A1c of 6.5 in December 2016 per primary care notes. On Thursday, May 11, he was seen in the [facility] emergency department secondary to bilateral foot pain. He was told this likely represented peripheral neuropathy was prescribed gabapentin. He couldn't afford it and so never picked it up. He continued to have bilateral foot pain over the weekend and subsequently developed right knee pain and swelling. On May 14, she developed nausea and vomiting times 1 in the morning. He remained nauseated the rest the day and also felt feverish. He presented to the [facility] ED late on May 14 and was found to have acute renal failure with an admission creatinine of 5.01. Leukocytosis was also noted with a white count of 16.4. His BNP was normal at 54 while troponin was mildly  elevated at 0.03. Chest x-ray showed no active disease. ECG showed sinus tachycardia at 123 bpm, without acute ST or T changes. He was placed on IV fluids and admitted for further evaluation. Follow-up troponin remains mildly elevated with a flat trend, at 0.04. We have been asked to evaluate. He denies any chest pain or dyspnea prior to admission. Creatinine is slightly better this morning at 4.77.  Inpatient Medications    . amLODipine  10 mg Oral Daily  . aspirin EC  81 mg Oral Daily  . cefTRIAXone (ROCEPHIN)  IV  1 g Intravenous Q24H  . cloNIDine  0.2 mg Oral TID  . docusate sodium  100 mg Oral BID  . heparin  5,000 Units Subcutaneous Q8H  . insulin aspart  0-9 Units Subcutaneous TID WC  . methylPREDNISolone (SOLU-MEDROL) injection  60 mg Intravenous Once  . pantoprazole (PROTONIX) IV  40 mg Intravenous Q12H  . simvastatin  20 mg Oral Daily  . sodium chloride flush  3 mL Intravenous Q12H    Family History    Family History  Problem Relation Age of Onset  . Hypertension Mother   . Diabetes type II Mother   . CAD Father   . Hypertension Father   . Alcohol abuse Father   . Alcohol abuse Brother   . Alcohol abuse Brother   . Kidney failure Brother   . Kidney failure Brother     Social History    Social History   Social History  . Marital Status: Single    Spouse Name: N/A  . Number of Children: N/A  .  Years of Education: N/A   Occupational History  . Not on file.   Social History Main Topics  . Smoking status: Never Smoker   . Smokeless tobacco: Not on file  . Alcohol Use: No  . Drug Use: No  . Sexual Activity: Not on file   Other Topics Concern  . Not on file   Social History Narrative   Lives in [place] with his son.  Disabled/doesn't work.  Does not routinely exercise.     Review of Systems    General:  No chills, +++ fever, no night sweats or weight changes.  Cardiovascular:  No chest pain, dyspnea on exertion, edema, orthopnea, palpitations,  paroxysmal nocturnal dyspnea. Dermatological: No rash, lesions/masses Respiratory: No cough, dyspnea Urologic: No hematuria, dysuria Abdominal:   +++ nausea, vomiting prior to admission, no diarrhea, bright red blood per rectum, melena, or hematemesis Neurologic:  No visual changes, wkns, changes in mental status. All other systems reviewed and are otherwise negative except as noted above.  Physical Exam    Blood pressure 137/90, pulse 98, temperature 98.9 F (37.2 C), temperature source Oral, resp. rate 18, height 6\' 2"  (1.88 m), weight 279 lb 8 oz (126.78 kg), SpO2 100 %.  General: Pleasant, NAD Psych: Normal affect. Neuro: Alert and oriented X 3. Moves all extremities spontaneously. HEENT: Normal  Neck: Supple without bruits. JVP is difficult to gauge secondary to girth. Lungs:  Resp regular and unlabored, diminished breath sounds bilaterally. Heart: RRR no s3, s4, or murmurs -distant heart sounds. Abdomen: Soft, non-tender, non-distended, BS + x 4.  Extremities: No clubbing, cyanosis or edema. DP/PT/Radials 2+ and equal bilaterally. Right knee swelling and tenderness noted.  Labs     Recent Labs  03/12/16 2057 03/13/16 0419  TROPONINI 0.03 0.04*   Lab Results  Component Value Date   WBC 13.7* 03/13/2016   HGB 11.1* 03/13/2016   HCT 34.2* 03/13/2016   MCV 79.4* 03/13/2016   PLT 256 03/13/2016    Recent Labs Lab 03/13/16 0419  NA 140  K 4.9  CL 109  CO2 20*  BUN 78*  CREATININE 4.77*  CALCIUM 9.4  PROT 7.7  BILITOT 0.5  ALKPHOS 55  ALT 26  AST 27  GLUCOSE 203*    Radiology Studies    Dg Chest 2 View  03/12/2016  CLINICAL DATA:  55 year old male with left-sided chest pain EXAM: CHEST  2 VIEW COMPARISON:  None. FINDINGS: The heart size and mediastinal contours are within normal limits. Both lungs are clear. The visualized skeletal structures are unremarkable. IMPRESSION: No active cardiopulmonary disease. Electronically Signed   By: Anner Crete M.D.    On: 03/12/2016 22:47   Dg Knee 2 Views Right  03/13/2016  CLINICAL DATA:  Right knee pain. States he fell on Friday, but was having pain previous to fall. No surgeries. EXAM: RIGHT KNEE - 3 VIEW COMPARISON:  None. FINDINGS: There is no evidence of fracture, dislocation, or joint effusion. Traction spurs from the patella at the insertion of the quadriceps tendon, and from the tibial tuberosity. There is no evidence of arthropathy or other focal bone abnormality. Soft tissues are unremarkable. IMPRESSION: No acute abnormality. Electronically Signed   By: Lucrezia Europe M.D.   On: 03/13/2016 10:34    ECG & Cardiac Imaging    Sinus tachycardia, 123, no acute st/t changes.  Assessment & Plan    1. Acute renal failure: Patient presented with nausea and vomiting on May 14 and finding of acute renal failure  with a creatinine of 5.01. He is unaware of his baseline creatinine but has never been told that he has a prior history of renal failure. He believes that he had labs drawn while in the ED on May 11, however there are no labs in the care everywhere in the ER note makes no mention of labs either. He is being treated for dehydration and creatinine is slightly better this morning at 4.77. Home doses of chlorthalidone, Lasix, lisinopril, and colchicine are on hold. Nephrology following.  2. Elevated troponin: The setting of acute renal failure, nausea, and vomiting, patient has very mild troponin elevation with a flat trend. He was 0.04 this morning. He has no prior history of chest pain or dyspnea. Check echocardiogram to evaluate LV function. If normal, would not pursue any ischemic evaluation at this time. Given risk factors of hypertension, diabetes, obesity, and family history, could pursue outpatient stress testing. Continue aspirin and statin.  3. Leukocytosis:  Patient with a low-grade fever on admission. Chest x-ray nonacute. Urine is normal. X-ray of swollen right knee did not show any acute  abnormality. Antibiotics per internal medicine.  4. Essential hypertension: Blood pressure elevated overnight and improved this morning. Home doses of ACE inhibitor and diuretics on hold. Follow.  5. Hyperlipidemia: Continue statin therapy.  6. Microcytic anemia: Follow.  7. Right knee pain and swelling: Pending steroid injection per internal medicine.  8. Bilateral foot pain: Likely represents peripheral neuropathy. He has a prescription for gabapentin at home, but was recently given to him by the [facility] ED. Defer to internal medicine.  9. Nausea and vomiting: Resolved. Degree of dehydration and renal failures appears to be out of proportion to his nausea and vomiting as he says he only had one episode of vomiting and otherwise had normal by mouth intake over the weekend except for yesterday.  Signed, Murray Hodgkins, NP 03/13/2016, 11:27 AM

## 2016-03-13 NOTE — Progress Notes (Signed)
Pt. Arrived to floor via stretcher, he was able to transfer from stretcher to bed without staff assistance, tele applied, box 10. second box verifier, Caleb Hughes, Therapist, sports. General room orientation given. How to use ascom and call bell system explained. Pt. Alert and oriented. Pain 7/10, medicated in ER prior to transport. Skin is warm and dry. Pt has multiple skin tags/moles scattered across entire body. Second nurse for skin check Caleb Hughes, Therapist, sports. NS at 100. Will continue to monitor pt.

## 2016-03-14 LAB — PROTEIN ELECTROPHORESIS, SERUM
A/G Ratio: 0.8 (ref 0.7–1.7)
ALPHA-2-GLOBULIN: 1.1 g/dL — AB (ref 0.4–1.0)
Albumin ELP: 3 g/dL (ref 2.9–4.4)
Alpha-1-Globulin: 0.5 g/dL — ABNORMAL HIGH (ref 0.0–0.4)
BETA GLOBULIN: 1.1 g/dL (ref 0.7–1.3)
GAMMA GLOBULIN: 1.1 g/dL (ref 0.4–1.8)
GLOBULIN, TOTAL: 3.8 g/dL (ref 2.2–3.9)
M-SPIKE, %: 0.2 g/dL — AB
Total Protein ELP: 6.8 g/dL (ref 6.0–8.5)

## 2016-03-14 LAB — BASIC METABOLIC PANEL
Anion gap: 10 (ref 5–15)
BUN: 79 mg/dL — AB (ref 6–20)
CO2: 20 mmol/L — ABNORMAL LOW (ref 22–32)
CREATININE: 4.38 mg/dL — AB (ref 0.61–1.24)
Calcium: 9.4 mg/dL (ref 8.9–10.3)
Chloride: 107 mmol/L (ref 101–111)
GFR calc Af Amer: 16 mL/min — ABNORMAL LOW (ref >60)
GFR, EST NON AFRICAN AMERICAN: 14 mL/min — AB (ref >60)
GLUCOSE: 212 mg/dL — AB (ref 65–99)
Potassium: 5.2 mmol/L — ABNORMAL HIGH (ref 3.5–5.1)
SODIUM: 137 mmol/L (ref 135–145)

## 2016-03-14 LAB — GLUCOSE, CAPILLARY
GLUCOSE-CAPILLARY: 165 mg/dL — AB (ref 65–99)
Glucose-Capillary: 169 mg/dL — ABNORMAL HIGH (ref 65–99)
Glucose-Capillary: 202 mg/dL — ABNORMAL HIGH (ref 65–99)
Glucose-Capillary: 214 mg/dL — ABNORMAL HIGH (ref 65–99)

## 2016-03-14 LAB — HEPATITIS B CORE ANTIBODY, IGM: HEP B C IGM: NEGATIVE

## 2016-03-14 LAB — HEPATITIS B SURFACE ANTIBODY,QUALITATIVE: HEP B S AB: NONREACTIVE

## 2016-03-14 LAB — ANCA TITERS: C-ANCA: <1:20 {titer}

## 2016-03-14 LAB — PARATHYROID HORMONE, INTACT (NO CA): PTH: 187 pg/mL — AB (ref 15–65)

## 2016-03-14 LAB — HEPATITIS C ANTIBODY (REFLEX)

## 2016-03-14 LAB — HIV ANTIBODY (ROUTINE TESTING W REFLEX): HIV Screen 4th Generation wRfx: NONREACTIVE

## 2016-03-14 LAB — MPO/PR-3 (ANCA) ANTIBODIES
ANCA Proteinase 3: <3.5 U/mL (ref 0.0–3.5)
Myeloperoxidase Abs: <9 U/mL (ref 0.0–9.0)

## 2016-03-14 LAB — ANTINUCLEAR ANTIBODIES, IFA: ANA Ab, IFA: NEGATIVE

## 2016-03-14 LAB — C3 COMPLEMENT: C3 Complement: 202 mg/dL — ABNORMAL HIGH (ref 82–167)

## 2016-03-14 LAB — C4 COMPLEMENT: COMPLEMENT C4, BODY FLUID: 30 mg/dL (ref 14–44)

## 2016-03-14 LAB — HEMOGLOBIN A1C: Hgb A1c MFr Bld: 8.5 % — ABNORMAL HIGH (ref 4.0–6.0)

## 2016-03-14 LAB — HCV COMMENT:

## 2016-03-14 LAB — HEPATITIS B SURFACE ANTIGEN: HEP B S AG: NEGATIVE

## 2016-03-14 MED ORDER — PREDNISONE 20 MG PO TABS
30.0000 mg | ORAL_TABLET | Freq: Every day | ORAL | Status: DC
  Administered 2016-03-14 – 2016-03-16 (×3): 30 mg via ORAL
  Filled 2016-03-14 (×3): qty 1

## 2016-03-14 NOTE — Consult Note (Signed)
ORTHOPAEDIC CONSULTATION  PATIENT NAME: Caleb Hughes DOB: Apr 12, 1961  MRN: [id number]  REQUESTING PHYSICIAN: Loletha Grayer, MD  Chief Complaint: Right knee pain  HPI: Caleb Hughes is a 55 y.o. male who complains of  a several week history of right knee pain. The patient has a history of gout and states that the pain is felt like a mild gouty attack. He noted the relatively sudden onset of right knee pain several weeks ago without any apparent trauma or aggravating event. He hadn't been using a combination of daily colchicine and ibuprofen without any significant improvement of the right knee pain. He occasionally uses a cane for ambulation. Several days ago he sustained a fall although he does not recall specific specifically twisting the right knee or landing on the knee. He has had progressive difficulty with his ambulation and now states that he has been unable to ambulate due to the right knee pain. He has seen some improvement following a dose of steroids yesterday.  He believes he may have had a low-grade fever at home. He denies any significant swelling of the right knee, although has noted the knee to be somewhat warm to the touch. He localizes the discomfort along the anterior aspect of the knee and states that flexion increases the pain.  Of note, upon admission he was noted to have acute renal failure. Uric acid yesterday was elevated at 14.9.  Past Medical History  Diagnosis Date  . Diabetes mellitus without complication ([place])   . Hypertension   . Hyperlipemia   . Morbid obesity ([place])   . Gout    History reviewed. No pertinent past surgical history. Social History   Social History  . Marital Status: Single    Spouse Name: N/A  . Number of Children: N/A  . Years of Education: N/A   Social History Main Topics  . Smoking status: Never Smoker   . Smokeless tobacco: None  . Alcohol Use: No  . Drug Use: No  . Sexual Activity: Not Asked   Other Topics Concern  . None    Social History Narrative   Lives in [place] with his son.  Disabled/doesn't work.  Does not routinely exercise.   Family History  Problem Relation Age of Onset  . Hypertension Mother   . Diabetes type II Mother   . CAD Father   . Hypertension Father   . Alcohol abuse Father   . Alcohol abuse Brother   . Alcohol abuse Brother   . Kidney failure Brother   . Kidney failure Brother    No Known Allergies Prior to Admission medications   Medication Sig Start Date End Date Taking? Authorizing Provider  amLODipine (NORVASC) 10 MG tablet Take 1 tablet by mouth daily. 02/29/16  Yes Historical Provider, MD  chlorthalidone (HYGROTON) 25 MG tablet Take 1 tablet by mouth 2 (two) times daily. 02/29/16  Yes Historical Provider, MD  cloNIDine (CATAPRES) 0.2 MG tablet Take 1 tablet by mouth 3 (three) times daily. 02/29/16  Yes Historical Provider, MD  COLCRYS 0.6 MG tablet Take 1 tablet by mouth daily. 02/29/16  Yes Historical Provider, MD  furosemide (LASIX) 20 MG tablet Take 1 tablet by mouth daily. 02/29/16  Yes Historical Provider, MD  lisinopril (PRINIVIL,ZESTRIL) 10 MG tablet Take 1 tablet by mouth daily. 02/29/16  Yes Historical Provider, MD  NOVOLOG MIX 70/30 (70-30) 100 UNIT/ML injection Inject 70 Units into the skin daily with breakfast.  02/29/16  Yes Historical Provider, MD  simvastatin (ZOCOR) 20 MG tablet  Take 1 tablet by mouth daily. 02/29/16  Yes Historical Provider, MD   Dg Chest 2 View  03/12/2016  CLINICAL DATA:  55 year old male with left-sided chest pain EXAM: CHEST  2 VIEW COMPARISON:  None. FINDINGS: The heart size and mediastinal contours are within normal limits. Both lungs are clear. The visualized skeletal structures are unremarkable. IMPRESSION: No active cardiopulmonary disease. Electronically Signed   By: Anner Crete M.D.   On: 03/12/2016 22:47   US Renal  03/13/2016  CLINICAL DATA:  Acute renal failure EXAM: RENAL / URINARY TRACT ULTRASOUND COMPLETE COMPARISON:  None. FINDINGS:  Right Kidney: Length: 11.0 cm. Mild increased parenchymal echogenicity. No mass or stone. No hydronephrosis. Left Kidney: Length: 11.8 cm. Echogenicity within normal limits. No mass or hydronephrosis visualized. Bladder: Appears normal for degree of bladder distention. IMPRESSION: 1. No acute finding.  No hydronephrosis. 2. Mild increase right renal parenchymal echogenicity suggesting medical renal disease. No other abnormality. Electronically Signed   By: Lajean Manes M.D.   On: 03/13/2016 16:16   Dg Knee 2 Views Right  03/13/2016  CLINICAL DATA:  Right knee pain. States he fell on Friday, but was having pain previous to fall. No surgeries. EXAM: RIGHT KNEE - 3 VIEW COMPARISON:  None. FINDINGS: There is no evidence of fracture, dislocation, or joint effusion. Traction spurs from the patella at the insertion of the quadriceps tendon, and from the tibial tuberosity. There is no evidence of arthropathy or other focal bone abnormality. Soft tissues are unremarkable. IMPRESSION: No acute abnormality. Electronically Signed   By: Lucrezia Europe M.D.   On: 03/13/2016 10:34    Positive ROS: All other systems have been reviewed and were otherwise negative with the exception of those mentioned in the HPI and as above.  Physical Exam: General: Alert and alert in no acute distress. HEENT: Atraumatic and normocephalic. Sclera are clear. Extraocular motion is intact. Oropharynx is clear with moist mucosa. Neck: Supple, nontender, good range of motion. No JVD or carotid bruits. Lungs: Clear to auscultation bilaterally. Cardiovascular: Regular rate and rhythm with normal S1 and S2. No murmurs. No gallops or rubs. Pedal pulses are trace and palpable bilaterally. Homans test is negative bilaterally. No significant pretibial or ankle edema. Abdomen: Soft, nontender, and nondistended. Bowel sounds are present. Skin: No lesions in the area of chief complaint Neurologic: Awake, alert, and oriented. Sensory function is  grossly intact. Motor strength is felt to be 5 over 5 bilaterally. No clonus or tremor. Good motor coordination. Lymphatic: No axillary or cervical lymphadenopathy  MUSCULOSKELETAL: Pertinent musculoskeletal exam is notable for the lower extremities. Hypertrophic toenails are noted bilaterally. There is no gross erythema or evidence of drainage to the feet. No significant swelling of the feet or ankles. No podagra. Good range of motion of the hips and ankles is appreciated.  Examination of the right knee demonstrates some mild soft tissue swelling without gross effusion. The patella is not ballotable. The knee is stable to varus and valgus stress. Lachman's test is negative. No significant medial or lateral joint line tenderness. There is some anterior tenderness to palpation. Slight increased warmth was noted to the right knee compared to the left. The patient demonstrates greater than 90 of flexion.  Assessment: Hyperuricemia, possible gouty flare Right knee pain  Plan: The findings were discussed in detail with the patient. He is actually appreciated some improvement following the steroids yesterday. He was apparently taking just a maintenance dose of colchicine which may not been sufficient to address  an acute gouty flare. It may be worthwhile to ask rheumatology to evaluate the patient for additional treatment options given his acute renal failure.  I have placed an order for a podiatry consult for nail care. He is at high risk for foot/toe complications given his diabetes and diabetic neuropathy.  James P. Holley Bouche M.D.

## 2016-03-14 NOTE — Evaluation (Signed)
Physical Therapy Evaluation Patient Details Name: Caleb Hughes MRN: [id number] DOB: April 03, 1961 Today's Date: 03/14/2016   History of Present Illness  Pt is a 55 y.o. male presenting to hospital with nausea, vomiting, and fever and admitted with acute renal failure.  Pt also with recent h/o fall and h/o R knee pain prior to fall (appears to be gout related pain).  PMH includes DM, htn, obesity.  Clinical Impression  Prior to admission, pt was modified independent ambulating with SPC.  Pt lives with his son in 2nd floor 1 level apt with elevator access.  Currently pt is min to mod assist supine to sit and sit to stand; min assist to ambulate bed to recliner with RW.  Pt demonstrating fatigue and R knee pain limiting activities.  Pt would benefit from skilled PT to address noted impairments and functional limitations.  D/t current assist levels and impaired activity tolerance, recommend pt discharge to STR when medically appropriate.     Follow Up Recommendations SNF    Equipment Recommendations       Recommendations for Other Services       Precautions / Restrictions Precautions Precautions: Fall Restrictions Weight Bearing Restrictions: No      Mobility  Bed Mobility Overal bed mobility: Needs Assistance Bed Mobility: Supine to Sit     Supine to sit: Min assist;Mod assist;HOB elevated     General bed mobility comments: increased effort and time to perform; significant use of side rail  Transfers Overall transfer level: Needs assistance Equipment used: Rolling walker (2 wheeled) Transfers: Sit to/from Stand Sit to Stand: Min assist;Mod assist         General transfer comment: assist to initiate stand and come to full stand; vc's required for hand and feet placement; decreased WB'ing noted through R LE  Ambulation/Gait Ambulation/Gait assistance: Min assist Ambulation Distance (Feet): 3 Feet (bed to recliner) Assistive device: Rolling walker (2 wheeled)   Gait  velocity: decreased   General Gait Details: antalgic, decreased stance time R LE, increased UE support through RW noted  Stairs            Wheelchair Mobility    Modified Rankin (Stroke Patients Only)       Balance Overall balance assessment: Needs assistance;History of Falls Sitting-balance support: Bilateral upper extremity supported;Feet supported Sitting balance-Leahy Scale: Good     Standing balance support: Bilateral upper extremity supported (on RW) Standing balance-Leahy Scale: Fair                               Pertinent Vitals/Pain Pain Assessment: 0-10 Pain Score: 6  Pain Location: R knee Pain Descriptors / Indicators: Sore;Tender Pain Intervention(s): Limited activity within patient's tolerance;Monitored during session;Repositioned  See flow sheet for HR and O2 vitals.    Home Living Family/patient expects to be discharged to:: Private residence Living Arrangements: Children (Pt's son) Available Help at Discharge: Family Type of Home: Apartment Home Access: [place]: One level (2nd floor apt) Home Equipment: Walker - 2 wheels;Cane - single point      Prior Function Level of Independence: Independent with assistive device(s)         Comments: Pt ambulates with SPC and denies any falls in past 6 months other than most recent fall.  Pt takes sink baths and has assist with meals occasionally.     Hand Dominance        Extremity/Trunk Assessment  Upper Extremity Assessment: Generalized weakness           Lower Extremity Assessment: RLE deficits/detail;LLE deficits/detail RLE Deficits / Details: R hip flexion 3+/5; R knee flexion/extension at least 3-/5 (limited ROM d/t pain); R DF at least 4/5; R knee extension close to neutral; R knee flexion 60 degrees (limited d/t pain) LLE Deficits / Details: L LE strength 4-/5 overall hip flexion, knee flexion/extension, and DF  Cervical / Trunk Assessment: Normal   Communication   Communication: No difficulties  Cognition Arousal/Alertness: Awake/alert Behavior During Therapy: WFL for tasks assessed/performed Overall Cognitive Status: Within Functional Limits for tasks assessed                      General Comments General comments (skin integrity, edema, etc.): R knee warm to touch compared to L knee  Nursing cleared pt for participation in physical therapy.  Pt agreeable to PT session.    Exercises Total Joint Exercises Ankle Circles/Pumps: AROM;Strengthening;Both;10 reps;Supine Quad Sets: AROM;Strengthening;Both;10 reps;Supine Short Arc Quad: Strengthening;Both;10 reps;Supine (AAROM R; AROM L) Heel Slides: Strengthening;Both;10 reps;Supine (AAROM R; AROM L) Hip ABduction/ADduction: Strengthening;Both;10 reps;Supine (AAROM R; AROM L)      Assessment/Plan    PT Assessment Patient needs continued PT services  PT Diagnosis Difficulty walking;Acute pain   PT Problem List Decreased strength;Decreased range of motion;Decreased activity tolerance;Decreased balance;Decreased mobility;Decreased knowledge of use of DME;Decreased knowledge of precautions;Pain  PT Treatment Interventions DME instruction;Gait training;Stair training;Functional mobility training;Therapeutic activities;Therapeutic exercise;Balance training;Patient/family education   PT Goals (Current goals can be found in the Care Plan section) Acute Rehab PT Goals Patient Stated Goal: to have less R knee pain PT Goal Formulation: With patient Time For Goal Achievement: 03/28/16 Potential to Achieve Goals: Fair    Frequency Min 2X/week   Barriers to discharge Decreased caregiver support      Co-evaluation               End of Session Equipment Utilized During Treatment: Gait belt Activity Tolerance: Patient limited by pain;Patient limited by fatigue Patient left: in chair;with call bell/phone within reach;with chair alarm set Nurse Communication: Mobility  status;Precautions         Time: [id number] PT Time Calculation (min) (ACUTE ONLY): 21 min   Charges:   PT Evaluation $PT Eval Low Complexity: 1 Procedure PT Treatments $Therapeutic Exercise: 8-22 mins   PT G CodesLeitha Bleak 04/04/16, 4:32 PM Leitha Bleak, [place]

## 2016-03-14 NOTE — Progress Notes (Signed)
Patient ID: Caleb Hughes, male   DOB: 1961-07-23, 55 y.o.   MRN: [id number] [facility] PROGRESS NOTE  Lafayette Meech [id number] DOB: 29-Mar-1961 DOA: 03/12/2016 PCP: Festus Aloe [facility]  HPI/Subjective: Patient still having some right knee pain. He states he is urinating well. Nausea vomiting has resolved and is tolerating his diet.  Objective: Filed Vitals:   03/14/16 0942 03/14/16 1129  BP: 142/97 132/68  Pulse: 75 70  Temp: 97.9 F (36.6 C) 97.7 F (36.5 C)  Resp: 18 18    Filed Weights   03/12/16 2009 03/13/16 0318 03/14/16 0422  Weight: 145.151 kg (320 lb) 126.78 kg (279 lb 8 oz) 129.321 kg (285 lb 1.6 oz)    ROS: Review of Systems  Constitutional: Negative for fever and chills.  Eyes: Negative for blurred vision.  Respiratory: Positive for shortness of breath. Negative for cough.   Cardiovascular: Negative for chest pain.  Gastrointestinal: Negative for nausea, vomiting, abdominal pain, diarrhea and constipation.  Genitourinary: Negative for dysuria.  Musculoskeletal: Positive for joint pain.  Neurological: Positive for weakness. Negative for dizziness and headaches.   Exam: Physical Exam  Constitutional: He is oriented to person, place, and time.  HENT:  Nose: No mucosal edema.  Mouth/Throat: No oropharyngeal exudate or posterior oropharyngeal edema.  Eyes: Conjunctivae, EOM and lids are normal. Pupils are equal, round, and reactive to light.  Neck: No JVD present. Carotid bruit is not present. No edema present. No thyroid mass and no thyromegaly present.  Cardiovascular: S1 normal and S2 normal.  Exam reveals no gallop.   No murmur heard. Pulses:      Dorsalis pedis pulses are 2+ on the right side, and 2+ on the left side.  Respiratory: No respiratory distress. He has no wheezes. He has no rhonchi. He has no rales.  GI: Soft. Bowel sounds are normal. There is no tenderness.  Musculoskeletal:       Right knee: He exhibits decreased range of motion  and swelling. Tenderness found. Patellar tendon tenderness noted.       Right ankle: He exhibits swelling.       Left ankle: He exhibits swelling.  Lymphadenopathy:    He has no cervical adenopathy.  Neurological: He is alert and oriented to person, place, and time. No cranial nerve deficit.  Skin: Skin is warm. No rash noted. Nails show no clubbing.  Right knee warm to the touch.  Psychiatric: He has a normal mood and affect.      Data Reviewed: Basic Metabolic Panel:  Recent Labs Lab 03/12/16 2015 03/13/16 0419 03/13/16 1425 03/14/16 0428  NA 139 140  --  137  K 5.3* 4.9  --  5.2*  CL 106 109  --  107  CO2 18* 20*  --  20*  GLUCOSE 200* 203*  --  212*  BUN 84* 78*  --  79*  CREATININE 5.01* 4.77*  --  4.38*  CALCIUM 10.1 9.4  --  9.4  PHOS  --   --  4.3  --    Liver Function Tests:  Recent Labs Lab 03/12/16 2015 03/13/16 0419  AST 35 27  ALT 31 26  ALKPHOS 64 55  BILITOT 0.8 0.5  PROT 8.7* 7.7  ALBUMIN 3.7 3.2*    Recent Labs Lab 03/12/16 2015  LIPASE 21   CBC:  Recent Labs Lab 03/12/16 2015 03/13/16 0419  WBC 16.4* 13.7*  HGB 12.0* 11.1*  HCT 38.0* 34.2*  MCV 80.1 79.4*  PLT 281 256  Cardiac Enzymes:  Recent Labs Lab 03/12/16 2057 03/13/16 0419 03/13/16 1057 03/13/16 1425  CKTOTAL  --   --   --  994*  TROPONINI 0.03 0.04* 0.03 0.03   BNP (last 3 results)  Recent Labs  03/12/16 2057  BNP 54.0   CBG:  Recent Labs Lab 03/13/16 1137 03/13/16 1721 03/13/16 2120 03/14/16 0727 03/14/16 1132  GLUCAP 114* 199* 251* 165* 169*     Studies: Dg Chest 2 View  03/12/2016  CLINICAL DATA:  55 year old male with left-sided chest pain EXAM: CHEST  2 VIEW COMPARISON:  None. FINDINGS: The heart size and mediastinal contours are within normal limits. Both lungs are clear. The visualized skeletal structures are unremarkable. IMPRESSION: No active cardiopulmonary disease. Electronically Signed   By: Anner Crete M.D.   On: 03/12/2016  22:47   US Renal  03/13/2016  CLINICAL DATA:  Acute renal failure EXAM: RENAL / URINARY TRACT ULTRASOUND COMPLETE COMPARISON:  None. FINDINGS: Right Kidney: Length: 11.0 cm. Mild increased parenchymal echogenicity. No mass or stone. No hydronephrosis. Left Kidney: Length: 11.8 cm. Echogenicity within normal limits. No mass or hydronephrosis visualized. Bladder: Appears normal for degree of bladder distention. IMPRESSION: 1. No acute finding.  No hydronephrosis. 2. Mild increase right renal parenchymal echogenicity suggesting medical renal disease. No other abnormality. Electronically Signed   By: Lajean Manes M.D.   On: 03/13/2016 16:16   Dg Knee 2 Views Right  03/13/2016  CLINICAL DATA:  Right knee pain. States he fell on Friday, but was having pain previous to fall. No surgeries. EXAM: RIGHT KNEE - 3 VIEW COMPARISON:  None. FINDINGS: There is no evidence of fracture, dislocation, or joint effusion. Traction spurs from the patella at the insertion of the quadriceps tendon, and from the tibial tuberosity. There is no evidence of arthropathy or other focal bone abnormality. Soft tissues are unremarkable. IMPRESSION: No acute abnormality. Electronically Signed   By: Lucrezia Europe M.D.   On: 03/13/2016 10:34    Scheduled Meds: . amLODipine  10 mg Oral Daily  . aspirin EC  81 mg Oral Daily  . cloNIDine  0.2 mg Oral TID  . docusate sodium  100 mg Oral BID  . heparin  5,000 Units Subcutaneous Q8H  . insulin aspart  0-9 Units Subcutaneous TID WC  . predniSONE  30 mg Oral Q breakfast  . sodium bicarbonate  1,300 mg Oral BID  . sodium chloride flush  3 mL Intravenous Q12H   Continuous Infusions: . sodium chloride 100 mL/hr at 03/14/16 0625    Assessment/Plan:  1. Acute kidney injury. Unknown baseline kidney function. Renal ultrasound showing medical renal disease. Continue IV fluid hydration. Hold nephrotoxic medication including ibuprofen, Lasix, high [place] and lisinopril. May end up needing a  kidney biopsy as outpatient. 2. Right knee pain and fever at home. Likely gout attack with high uric acid. Stop antibiotics since afebrile here. We'll give prednisone taper. Likely will need low-dose allopurinol as outpatient. Awaiting kidney function to get better prior to dosing colchicine 3. Nausea vomiting. Tolerated solid food 4. Essential hypertension continue clonidine and Norvasc 5. Hyperlipidemia unspecified continue simvastatin 6. Type 2 diabetes without complication. Sugars will be high with the dose of steroid ear and put on sliding scale. 7. Slight troponin elevation likely false positive with acute renal failure. No chest pain or shortness of breath.  Code Status:     Code Status Orders        Start     Ordered  03/13/16 0312  Full code   Continuous     03/13/16 0312    Code Status History    Date Active Date Inactive Code Status Order ID Comments User Context   This patient has a current code status but no historical code status.     Disposition Plan: To be determined  Consultants:  Nephrology  Orthopedic surgery  Time spent: 24 minutes  [place], [place]

## 2016-03-14 NOTE — Progress Notes (Signed)
Notified MD of potassium 5.2; MD not concerned, no new orders given at this time. Nursing staff will continue to monitor. Earleen Reaper, RN

## 2016-03-14 NOTE — Progress Notes (Signed)
Subjective:  Overall doing fair Serum creatinine only slightly improved to 4.3 at Urine output 2400 cc Urine protein/creatinine ratio as 3.55 gm Serology studies are pending Patient is able to eat without nausea or vomiting   Objective:  Vital signs in last 24 hours:  Temp:  [97.7 F (36.5 C)-99.2 F (37.3 C)] 97.7 F (36.5 C) (05/16 1129) Pulse Rate:  [70-87] 70 (05/16 1129) Resp:  [16-18] 18 (05/16 1129) BP: (124-142)/(68-97) 132/68 mmHg (05/16 1129) SpO2:  [94 %-99 %] 99 % (05/16 1129) Weight:  [129.321 kg (285 lb 1.6 oz)] 129.321 kg (285 lb 1.6 oz) (05/16 0422)  Weight change: -15.831 kg (-34 lb 14.4 oz) Filed Weights   03/12/16 2009 03/13/16 0318 03/14/16 0422  Weight: 145.151 kg (320 lb) 126.78 kg (279 lb 8 oz) 129.321 kg (285 lb 1.6 oz)    Intake/Output:    Intake/Output Summary (Last 24 hours) at 03/14/16 1354 Last data filed at 03/14/16 1348  Gross per 24 hour  Intake    840 ml  Output   1750 ml  Net   -910 ml     Physical Exam: General: No acute distress, lying in the bed  HEENT Anicteric, moist mucous membranes  Neck supple  Pulm/lungs Normal effort, clear to auscultation bilaterally  CVS/Heart Regular, no rub or gallop  Abdomen:  Soft, nontender  Extremities: No peripheral edema, right knee swelling  Neurologic: Alert, oriented  Skin: No acute rashes          Basic Metabolic Panel:   Recent Labs Lab 03/12/16 2015 03/13/16 0419 03/13/16 1425 03/14/16 0428  NA 139 140  --  137  K 5.3* 4.9  --  5.2*  CL 106 109  --  107  CO2 18* 20*  --  20*  GLUCOSE 200* 203*  --  212*  BUN 84* 78*  --  79*  CREATININE 5.01* 4.77*  --  4.38*  CALCIUM 10.1 9.4  --  9.4  PHOS  --   --  4.3  --      CBC:  Recent Labs Lab 03/12/16 2015 03/13/16 0419  WBC 16.4* 13.7*  HGB 12.0* 11.1*  HCT 38.0* 34.2*  MCV 80.1 79.4*  PLT 281 256      Microbiology:  No results found for this or any previous visit (from the past 720  hour(s)).  Coagulation Studies: No results for input(s): LABPROT, INR in the last 72 hours.  Urinalysis:  Recent Labs  03/12/16 2057  COLORURINE YELLOW*  LABSPEC 1.010  PHURINE 5.0  GLUCOSEU 50*  HGBUR 3+*  BILIRUBINUR NEGATIVE  KETONESUR TRACE*  PROTEINUR >500*  NITRITE NEGATIVE  LEUKOCYTESUR NEGATIVE      Imaging: Dg Chest 2 View  03/12/2016  CLINICAL DATA:  55 year old male with left-sided chest pain EXAM: CHEST  2 VIEW COMPARISON:  None. FINDINGS: The heart size and mediastinal contours are within normal limits. Both lungs are clear. The visualized skeletal structures are unremarkable. IMPRESSION: No active cardiopulmonary disease. Electronically Signed   By: Anner Crete M.D.   On: 03/12/2016 22:47   US Renal  03/13/2016  CLINICAL DATA:  Acute renal failure EXAM: RENAL / URINARY TRACT ULTRASOUND COMPLETE COMPARISON:  None. FINDINGS: Right Kidney: Length: 11.0 cm. Mild increased parenchymal echogenicity. No mass or stone. No hydronephrosis. Left Kidney: Length: 11.8 cm. Echogenicity within normal limits. No mass or hydronephrosis visualized. Bladder: Appears normal for degree of bladder distention. IMPRESSION: 1. No acute finding.  No hydronephrosis. 2. Mild increase right renal  parenchymal echogenicity suggesting medical renal disease. No other abnormality. Electronically Signed   By: Lajean Manes M.D.   On: 03/13/2016 16:16   Dg Knee 2 Views Right  03/13/2016  CLINICAL DATA:  Right knee pain. States he fell on Friday, but was having pain previous to fall. No surgeries. EXAM: RIGHT KNEE - 3 VIEW COMPARISON:  None. FINDINGS: There is no evidence of fracture, dislocation, or joint effusion. Traction spurs from the patella at the insertion of the quadriceps tendon, and from the tibial tuberosity. There is no evidence of arthropathy or other focal bone abnormality. Soft tissues are unremarkable. IMPRESSION: No acute abnormality. Electronically Signed   By: Lucrezia Europe M.D.   On:  03/13/2016 10:34     Medications:   . sodium chloride 100 mL/hr at 03/14/16 0625   . amLODipine  10 mg Oral Daily  . aspirin EC  81 mg Oral Daily  . cefTRIAXone (ROCEPHIN)  IV  1 g Intravenous Q24H  . cloNIDine  0.2 mg Oral TID  . docusate sodium  100 mg Oral BID  . heparin  5,000 Units Subcutaneous Q8H  . insulin aspart  0-9 Units Subcutaneous TID WC  . predniSONE  30 mg Oral Q breakfast  . sodium bicarbonate  1,300 mg Oral BID  . sodium chloride flush  3 mL Intravenous Q12H   acetaminophen **OR** acetaminophen, bisacodyl, HYDROcodone-acetaminophen, ondansetron **OR** ondansetron (ZOFRAN) IV  Assessment/ Plan:  55 y.o. male male with a PMHX of medical problems of Long-standing diabetes type 2 with neuropathy, hypertension, hyperlipidemia, obesity, schizophrenia, gout , was admitted on 03/12/2016 with ARF.   1. Acute renal failure, with proteinuria, hematuria, glucosuria Baseline creatinine is not known at this time Renal imaging is pending Urinalysis shows proteinuria, hematuria and glucosuria urine protein/creatinine ratio is 3.55 gm Serologies pending CK levels mildly elevated Electrolytes and volume status are acceptable No acute indication for dialysis at present Discussed with patient re: possibiliy of needing kidney biopsy  2. Metabolic acidosis Likely secondary to renal failure Continue sodium bicarbonate   LOS: 2 Caleb Hughes 5/16/20171:54 PM

## 2016-03-14 NOTE — Care Management Important Message (Signed)
Important Message  Patient Details  Name: Caleb Hughes MRN: [id number] Date of Birth: 1961/02/02   Medicare Important Message Given:  Yes    [name] 03/14/2016, 3:13 PM

## 2016-03-15 ENCOUNTER — Inpatient Hospital Stay: Payer: Medicare Other

## 2016-03-15 DIAGNOSIS — N179 Acute kidney failure, unspecified: Principal | ICD-10-CM

## 2016-03-15 DIAGNOSIS — I10 Essential (primary) hypertension: Secondary | ICD-10-CM

## 2016-03-15 DIAGNOSIS — E669 Obesity, unspecified: Secondary | ICD-10-CM

## 2016-03-15 DIAGNOSIS — M109 Gout, unspecified: Secondary | ICD-10-CM

## 2016-03-15 DIAGNOSIS — E119 Type 2 diabetes mellitus without complications: Secondary | ICD-10-CM

## 2016-03-15 DIAGNOSIS — G629 Polyneuropathy, unspecified: Secondary | ICD-10-CM

## 2016-03-15 DIAGNOSIS — E785 Hyperlipidemia, unspecified: Secondary | ICD-10-CM

## 2016-03-15 DIAGNOSIS — D472 Monoclonal gammopathy: Secondary | ICD-10-CM | POA: Insufficient documentation

## 2016-03-15 DIAGNOSIS — R630 Anorexia: Secondary | ICD-10-CM

## 2016-03-15 LAB — PROTEIN ELECTRO, RANDOM URINE
ALPHA-1-GLOBULIN, U: 7.2 %
ALPHA-2-GLOBULIN, U: 16 %
Albumin ELP, Urine: 47.1 %
Beta Globulin, U: 13.4 %
Gamma Globulin, U: 16.2 %
TOTAL PROTEIN, URINE-UPE24: 65.3 mg/dL

## 2016-03-15 LAB — BASIC METABOLIC PANEL
ANION GAP: 7 (ref 5–15)
BUN: 88 mg/dL — AB (ref 6–20)
CHLORIDE: 109 mmol/L (ref 101–111)
CO2: 22 mmol/L (ref 22–32)
Calcium: 9.2 mg/dL (ref 8.9–10.3)
Creatinine, Ser: 4.05 mg/dL — ABNORMAL HIGH (ref 0.61–1.24)
GFR calc Af Amer: 18 mL/min — ABNORMAL LOW (ref >60)
GFR, EST NON AFRICAN AMERICAN: 15 mL/min — AB (ref >60)
GLUCOSE: 170 mg/dL — AB (ref 65–99)
POTASSIUM: 5 mmol/L (ref 3.5–5.1)
Sodium: 138 mmol/L (ref 135–145)

## 2016-03-15 LAB — GLUCOSE, CAPILLARY
GLUCOSE-CAPILLARY: 137 mg/dL — AB (ref 65–99)
GLUCOSE-CAPILLARY: 210 mg/dL — AB (ref 65–99)
GLUCOSE-CAPILLARY: 143 mg/dL — AB (ref 65–99)
Glucose-Capillary: 177 mg/dL — ABNORMAL HIGH (ref 65–99)

## 2016-03-15 MED ORDER — INSULIN ASPART PROT & ASPART (70-30 MIX) 100 UNIT/ML ~~LOC~~ SUSP
8.0000 [IU] | Freq: Two times a day (BID) | SUBCUTANEOUS | Status: DC
  Administered 2016-03-15 – 2016-03-16 (×2): 8 [IU] via SUBCUTANEOUS
  Filled 2016-03-15: qty 10
  Filled 2016-03-15: qty 80

## 2016-03-15 NOTE — Clinical Social Work Note (Signed)
Clinical Social Work Assessment  Patient Details  Name: Caleb Hughes MRN: [id number] Date of Birth: 10/19/1961  Date of referral:  03/15/16               Reason for consult:  Facility Placement                Permission sought to share information with:    Permission granted to share information::     Name::        Agency::     Relationship::     Contact Information:     Housing/Transportation Living arrangements for the past 2 months:  Single Family Home Source of Information:  Patient Patient Interpreter Needed:  None Criminal Activity/Legal Involvement Pertinent to Current Situation/Hospitalization:  No - Comment as needed Significant Relationships:  Adult Children Lives with:  Adult Children Do you feel safe going back to the place where you live?  Yes Need for family participation in patient care:  Yes (Comment)  Care giving concerns:  Patient resides at home with his adult son with him.    Social Worker assessment / plan:  CSW met with patient due to PT recommending STR. Patient informed CSW that if he has to go he will, but he stated that he believes he can return home and that his son has already informed him that he will be able to help him. Patient inquired about home health services and CSW explained home health services but also stated that RN CM will come to speak about it to him more. Patient is wanting to do home health at this time. Employment status:  Disabled (Comment on whether or not currently receiving Disability) Insurance information:  Medicare PT Recommendations:  Skilled Nursing Facility Information / Referral to community resources:     Patient/Family's Response to care:  Patient expressed appreciation for CSW assistance.  Patient/Family's Understanding of and Emotional Response to Diagnosis, Current Treatment, and Prognosis:  Patient expressed he would rather return home with home health. Emotional Assessment Appearance:  Appears stated  age Attitude/Demeanor/Rapport:   (pleasant and cooperative) Affect (typically observed):  Calm, Adaptable Orientation:  Oriented to Self, Oriented to Place, Oriented to  Time, Oriented to Situation Alcohol / Substance use:  Not Applicable Psych involvement (Current and /or in the community):  No (Comment)  Discharge Needs  Concerns to be addressed:  Care Coordination Readmission within the last 30 days:  No Current discharge risk:  None Barriers to Discharge:  No Barriers Identified    , LCSW 03/15/2016, 11:00 AM  

## 2016-03-15 NOTE — Progress Notes (Signed)
Subjective:  Overall doing fair Serum creatinine only slightly improved to 4.1/GFR 18 Urine output 2400 cc Urine protein/creatinine ratio as 3.55 gm Serology studies are  Negative Small M Spike noted in urine and blood Patient is able to eat without nausea or vomiting   Objective:  Vital signs in last 24 hours:  Temp:  [97.6 F (36.4 C)-97.9 F (36.6 C)] 97.6 F (36.4 C) (05/17 0440) Pulse Rate:  [60-96] 60 (05/17 0440) Resp:  [17-18] 18 (05/17 0440) BP: (114-142)/(55-97) 114/55 mmHg (05/17 0440) SpO2:  [95 %-99 %] 97 % (05/17 0440) Weight:  [130.364 kg (287 lb 6.4 oz)] 130.364 kg (287 lb 6.4 oz) (05/17 0440)  Weight change: 1.043 kg (2 lb 4.8 oz) Filed Weights   03/13/16 0318 03/14/16 0422 03/15/16 0440  Weight: 126.78 kg (279 lb 8 oz) 129.321 kg (285 lb 1.6 oz) 130.364 kg (287 lb 6.4 oz)    Intake/Output:    Intake/Output Summary (Last 24 hours) at 03/15/16 0857 Last data filed at 03/15/16 0455  Gross per 24 hour  Intake 4391.67 ml  Output   2350 ml  Net 2041.67 ml     Physical Exam: General: No acute distress, lying in the bed  HEENT Anicteric, moist mucous membranes  Neck supple  Pulm/lungs Normal effort, clear to auscultation bilaterally  CVS/Heart Regular, no rub or gallop  Abdomen:  Soft, nontender  Extremities: No peripheral edema, right knee swelling  Neurologic: Alert, oriented  Skin: No acute rashes          Basic Metabolic Panel:   Recent Labs Lab 03/12/16 2015 03/13/16 0419 03/13/16 1425 03/14/16 0428 03/15/16 0426  NA 139 140  --  137 138  K 5.3* 4.9  --  5.2* 5.0  CL 106 109  --  107 109  CO2 18* 20*  --  20* 22  GLUCOSE 200* 203*  --  212* 170*  BUN 84* 78*  --  79* 88*  CREATININE 5.01* 4.77*  --  4.38* 4.05*  CALCIUM 10.1 9.4  --  9.4 9.2  PHOS  --   --  4.3  --   --      CBC:  Recent Labs Lab 03/12/16 2015 03/13/16 0419  WBC 16.4* 13.7*  HGB 12.0* 11.1*  HCT 38.0* 34.2*  MCV 80.1 79.4*  PLT 281 256       Microbiology:  No results found for this or any previous visit (from the past 720 hour(s)).  Coagulation Studies: No results for input(s): LABPROT, INR in the last 72 hours.  Urinalysis:  Recent Labs  03/12/16 2057  COLORURINE YELLOW*  LABSPEC 1.010  PHURINE 5.0  GLUCOSEU 50*  HGBUR 3+*  BILIRUBINUR NEGATIVE  KETONESUR TRACE*  PROTEINUR >500*  NITRITE NEGATIVE  LEUKOCYTESUR NEGATIVE      Imaging: US Renal  03/13/2016  CLINICAL DATA:  Acute renal failure EXAM: RENAL / URINARY TRACT ULTRASOUND COMPLETE COMPARISON:  None. FINDINGS: Right Kidney: Length: 11.0 cm. Mild increased parenchymal echogenicity. No mass or stone. No hydronephrosis. Left Kidney: Length: 11.8 cm. Echogenicity within normal limits. No mass or hydronephrosis visualized. Bladder: Appears normal for degree of bladder distention. IMPRESSION: 1. No acute finding.  No hydronephrosis. 2. Mild increase right renal parenchymal echogenicity suggesting medical renal disease. No other abnormality. Electronically Signed   By: Lajean Manes M.D.   On: 03/13/2016 16:16   Dg Knee 2 Views Right  03/13/2016  CLINICAL DATA:  Right knee pain. States he fell on Friday, but was having  pain previous to fall. No surgeries. EXAM: RIGHT KNEE - 3 VIEW COMPARISON:  None. FINDINGS: There is no evidence of fracture, dislocation, or joint effusion. Traction spurs from the patella at the insertion of the quadriceps tendon, and from the tibial tuberosity. There is no evidence of arthropathy or other focal bone abnormality. Soft tissues are unremarkable. IMPRESSION: No acute abnormality. Electronically Signed   By: Lucrezia Europe M.D.   On: 03/13/2016 10:34     Medications:   . sodium chloride 100 mL/hr at 03/15/16 0200   . amLODipine  10 mg Oral Daily  . aspirin EC  81 mg Oral Daily  . cloNIDine  0.2 mg Oral TID  . docusate sodium  100 mg Oral BID  . heparin  5,000 Units Subcutaneous Q8H  . insulin aspart  0-9 Units Subcutaneous TID  WC  . predniSONE  30 mg Oral Q breakfast  . sodium bicarbonate  1,300 mg Oral BID  . sodium chloride flush  3 mL Intravenous Q12H   acetaminophen **OR** acetaminophen, bisacodyl, HYDROcodone-acetaminophen, ondansetron **OR** ondansetron (ZOFRAN) IV  Assessment/ Plan:  55 y.o. male male with a PMHX of medical problems of Long-standing diabetes type 2 with neuropathy, hypertension, hyperlipidemia, obesity, schizophrenia, gout , was admitted on 03/12/2016 with ARF.   1. 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 Metabolic acidosis Likely secondary to renal failure Continue sodium bicarbonate  3. DM-2 with CKD HbA1c 8.5% Hold off ace-i until creatinine stabilizes   LOS: 3 Caleb Hughes 5/17/20178:57 AM

## 2016-03-15 NOTE — Progress Notes (Signed)
Physical Therapy Treatment Patient Details Name: Caleb Hughes MRN: [id number] DOB: [birth year] Today's Date: 03/15/2016    History of Present Illness Pt is a 55 y.o. male presenting to hospital with nausea, vomiting, and fever and admitted with acute renal failure.  Pt also with recent h/o fall and h/o R knee pain prior to fall (appears to be gout related pain).  PMH includes DM, htn, obesity.    PT Comments    Pt continues with moderate right knee pain. Pt notes he just returned to bed from up in chair and bath. Pt prefers to remain in bed at this time; agreeable to bed exercises. Pt instructed and participates in supine bed exercises requiring mild assist on right with several exercises due to weakness in quad. Encouraged concept of quality versus quantity, as pt often attempts to "race" through exercises with decreasing technique/control. Pt encouraged to perform exercises several times a day. Continue PT to progress strength and endurance to improve all functional mobility.   Follow Up Recommendations  SNF     Equipment Recommendations       Recommendations for Other Services       Precautions / Restrictions Restrictions Weight Bearing Restrictions: No    Mobility  Bed Mobility               General bed mobility comments: Not tested; refuses up/out of bed at this time, as pt just returned to bed  Transfers                    Ambulation/Gait                 Stairs            Wheelchair Mobility    Modified Rankin (Stroke Patients Only)       Balance                                    Cognition Arousal/Alertness: Awake/alert Behavior During Therapy: WFL for tasks assessed/performed Overall Cognitive Status: Within Functional Limits for tasks assessed                      Exercises General Exercises - Lower Extremity Ankle Circles/Pumps: AROM;Both;20 reps;Supine Quad Sets: Strengthening;Both;20  reps;Supine Gluteal Sets: Strengthening;Both;20 reps;Supine Short Arc Quad: AROM;Both;20 reps;Supine (challenging on R) Heel Slides: AROM;Both;20 reps;Supine Hip ABduction/ADduction: AROM;Both;20 reps;Supine (assist on R to avoid friction on bed) Straight Leg Raises: AROM;Strengthening;Both;10 reps;Supine    General Comments General comments (skin integrity, edema, etc.): R knee continues warmer than lefft       Pertinent Vitals/Pain Pain Assessment: 0-10 Pain Score: 5  Pain Location: R knee Pain Intervention(s): Premedicated before session;Monitored during session    Home Living                      Prior Function            PT Goals (current goals can now be found in the care plan section)      Frequency  Min 2X/week    PT Plan Current plan remains appropriate    Co-evaluation             End of Session   Activity Tolerance: Patient tolerated treatment well;Patient limited by fatigue (weakness, R knee pain) Patient left: in bed;with call bell/phone within reach;with bed alarm set     Time: 1540-1600  PT Time Calculation (min) (ACUTE ONLY): 20 min  Charges:  $Therapeutic Exercise: 8-22 mins                    G Codes:      Charlaine Dalton, PTA 03/15/2016, 4:03 PM

## 2016-03-15 NOTE — Progress Notes (Addendum)
Patient ID: Caleb Hughes, male   DOB: August 05, 1961, 55 y.o.   MRN: [id number] [facility] PROGRESS NOTE  Caleb Hughes [id number] DOB: May 15, 1961 DOA: 03/12/2016 PCP: Festus Aloe [facility]  HPI/Subjective: Patient still having some right knee pain but better than what it was.  Objective: Filed Vitals:   03/15/16 1129 03/15/16 1540  BP: 125/70   Pulse: 64 64  Temp:    Resp: 18     Filed Weights   03/13/16 0318 03/14/16 0422 03/15/16 0440  Weight: 126.78 kg (279 lb 8 oz) 129.321 kg (285 lb 1.6 oz) 130.364 kg (287 lb 6.4 oz)    ROS: Review of Systems  Constitutional: Negative for fever and chills.  Eyes: Negative for blurred vision.  Respiratory: Positive for shortness of breath. Negative for cough.   Cardiovascular: Negative for chest pain.  Gastrointestinal: Negative for nausea, vomiting, abdominal pain, diarrhea and constipation.  Genitourinary: Negative for dysuria.  Musculoskeletal: Positive for joint pain.  Neurological: Positive for weakness. Negative for dizziness and headaches.   Exam: Physical Exam  Constitutional: He is oriented to person, place, and time.  HENT:  Nose: No mucosal edema.  Mouth/Throat: No oropharyngeal exudate or posterior oropharyngeal edema.  Eyes: Conjunctivae, EOM and lids are normal. Pupils are equal, round, and reactive to light.  Neck: No JVD present. Carotid bruit is not present. No edema present. No thyroid mass and no thyromegaly present.  Cardiovascular: S1 normal and S2 normal.  Exam reveals no gallop.   No murmur heard. Pulses:      Dorsalis pedis pulses are 2+ on the right side, and 2+ on the left side.  Respiratory: No respiratory distress. He has no wheezes. He has no rhonchi. He has no rales.  GI: Soft. Bowel sounds are normal. There is no tenderness.  Musculoskeletal:       Right knee: He exhibits decreased range of motion and swelling. Tenderness found. Patellar tendon tenderness noted.       Right ankle: He exhibits  swelling.       Left ankle: He exhibits swelling.  Lymphadenopathy:    He has no cervical adenopathy.  Neurological: He is alert and oriented to person, place, and time. No cranial nerve deficit.  Skin: Skin is warm. No rash noted. Nails show no clubbing.  Right knee warm to the touch.  Psychiatric: He has a normal mood and affect.      Data Reviewed: Basic Metabolic Panel:  Recent Labs Lab 03/12/16 2015 03/13/16 0419 03/13/16 1425 03/14/16 0428 03/15/16 0426  NA 139 140  --  137 138  K 5.3* 4.9  --  5.2* 5.0  CL 106 109  --  107 109  CO2 18* 20*  --  20* 22  GLUCOSE 200* 203*  --  212* 170*  BUN 84* 78*  --  79* 88*  CREATININE 5.01* 4.77*  --  4.38* 4.05*  CALCIUM 10.1 9.4  --  9.4 9.2  PHOS  --   --  4.3  --   --    Liver Function Tests:  Recent Labs Lab 03/12/16 2015 03/13/16 0419  AST 35 27  ALT 31 26  ALKPHOS 64 55  BILITOT 0.8 0.5  PROT 8.7* 7.7  ALBUMIN 3.7 3.2*    Recent Labs Lab 03/12/16 2015  LIPASE 21   CBC:  Recent Labs Lab 03/12/16 2015 03/13/16 0419  WBC 16.4* 13.7*  HGB 12.0* 11.1*  HCT 38.0* 34.2*  MCV 80.1 79.4*  PLT 281 256  Cardiac Enzymes:  Recent Labs Lab 03/12/16 2057 03/13/16 0419 03/13/16 1057 03/13/16 1425  CKTOTAL  --   --   --  994*  TROPONINI 0.03 0.04* 0.03 0.03   BNP (last 3 results)  Recent Labs  03/12/16 2057  BNP 54.0   CBG:  Recent Labs Lab 03/14/16 1132 03/14/16 1630 03/14/16 2122 03/15/16 0734 03/15/16 1141  GLUCAP 169* 202* 214* 137* 177*     Studies: US Renal  03/13/2016  CLINICAL DATA:  Acute renal failure EXAM: RENAL / URINARY TRACT ULTRASOUND COMPLETE COMPARISON:  None. FINDINGS: Right Kidney: Length: 11.0 cm. Mild increased parenchymal echogenicity. No mass or stone. No hydronephrosis. Left Kidney: Length: 11.8 cm. Echogenicity within normal limits. No mass or hydronephrosis visualized. Bladder: Appears normal for degree of bladder distention. IMPRESSION: 1. No acute finding.   No hydronephrosis. 2. Mild increase right renal parenchymal echogenicity suggesting medical renal disease. No other abnormality. Electronically Signed   By: Lajean Manes M.D.   On: 03/13/2016 16:16    Scheduled Meds: . amLODipine  10 mg Oral Daily  . aspirin EC  81 mg Oral Daily  . cloNIDine  0.2 mg Oral TID  . docusate sodium  100 mg Oral BID  . heparin  5,000 Units Subcutaneous Q8H  . insulin aspart  0-9 Units Subcutaneous TID WC  . insulin aspart protamine- aspart  8 Units Subcutaneous BID WC  . predniSONE  30 mg Oral Q breakfast  . sodium bicarbonate  1,300 mg Oral BID  . sodium chloride flush  3 mL Intravenous Q12H   Continuous Infusions: . sodium chloride 50 mL/hr at 03/15/16 1213    Assessment/Plan:  1. Acute kidney injury. Unknown baseline kidney function. Renal ultrasound showing medical renal disease. Continue IV fluid hydration. Hold nephrotoxic medication including ibuprofen, Lasix, hyGroton and lisinopril. May end up needing a kidney biopsy. Patient's creatinine is slow to improve.  as per nephrology, watch creatinine in another day or so here in the hospital.  2. Right knee pain and fever at home. Likely gout attack with high uric acid. Stop antibiotics since afebrile here. We'll give prednisone taper. Likely will need low-dose allopurinol as outpatient. Awaiting kidney function to get better prior to dosing colchicine 3. Nausea vomiting. Tolerated solid food 4. Essential hypertension continue clonidine and Norvasc 5. Hyperlipidemia unspecified continue simvastatin 6. Type 2 diabetes without complication. restart low-dose 70/30 insulin  7. Slight troponin elevation likely false positive with acute renal failure. No chest pain or shortness of breath.  Code Status:     Code Status Orders        Start     Ordered   03/13/16 0312  Full code   Continuous     03/13/16 0312    Code Status History    Date Active Date Inactive Code Status Order ID Comments User Context    This patient has a current code status but no historical code status.     Disposition Plan: To be determined  Consultants:  Nephrology  Orthopedic surgery  Time spent: 22 minutes  [place], [place]

## 2016-03-15 NOTE — Consult Note (Signed)
[facility] CONSULT NOTE  Patient Care Team: [facility] as PCP - General  CHIEF COMPLAINTS/PURPOSE OF CONSULTATION:  Monoclonal gammopathy  HISTORY OF PRESENTING ILLNESS:  Caleb Hughes 55 y.o.  male with a history of hypertension and diabetes- is currently admitted to the hospital for acute renal failure. Patient states that he had tingling and numbness of his extremities for the last many weeks; which had recently gotten worse. He had been taking ibuprofen for the pain intermittently. She presented to the hospital with nausea vomiting- denies any diarrhea or abdominal pain. Chronic notes found to be 5/ with a BUN of 84. Is currently getting IV fluids. As a part of the workup he had monoclonal workup showed- M protein of 0.2 g protein per deciliter.  His appetite is poor. Otherwise no significant weight loss. Denies any significant back pain; chronic back pain. Denies any unusual shortness of breath or chest pain or cough or fevers.  ROS: A complete 10 point review of system is done which is negative except mentioned above in history of present illness  MEDICAL HISTORY:  Past Medical History  Diagnosis Date  . Diabetes mellitus without complication ([place])   . Hypertension   . Hyperlipemia   . Morbid obesity ([place])   . Gout     SURGICAL HISTORY: History reviewed. No pertinent past surgical history.  SOCIAL HISTORY:No smoking; occasional alcohol.  Patient lives at home with his son. Currently unemployed. Social History   Social History  . Marital Status: Single    Spouse Name: N/A  . Number of Children: N/A  . Years of Education: N/A   Occupational History  . Not on file.   Social History Main Topics  . Smoking status: Never Smoker   . Smokeless tobacco: Not on file  . Alcohol Use: No  . Drug Use: No  . Sexual Activity: Not on file   Other Topics Concern  . Not on file   Social History Narrative   Lives in [place] with his son.   Disabled/doesn't work.  Does not routinely exercise.    FAMILY HISTORY:No family history of cancer. Family History  Problem Relation Age of Onset  . Hypertension Mother   . Diabetes type II Mother   . CAD Father   . Hypertension Father   . Alcohol abuse Father   . Alcohol abuse Brother   . Alcohol abuse Brother   . Kidney failure Brother   . Kidney failure Brother     ALLERGIES:  has No Known Allergies.  MEDICATIONS:  Current Facility-Administered Medications  Medication Dose Route Frequency Provider Last Rate Last Dose  . 0.9 %  sodium chloride infusion   Intravenous Continuous Loletha Grayer, MD 50 mL/hr at 03/15/16 1213    . acetaminophen (TYLENOL) tablet 650 mg  650 mg Oral Q6H PRN Idelle Crouch, MD       Or  . acetaminophen (TYLENOL) suppository 650 mg  650 mg Rectal Q6H PRN Idelle Crouch, MD      . amLODipine (NORVASC) tablet 10 mg  10 mg Oral Daily Idelle Crouch, MD   10 mg at 03/15/16 0958  . aspirin EC tablet 81 mg  81 mg Oral Daily Idelle Crouch, MD   81 mg at 03/15/16 [id number]  . bisacodyl (DULCOLAX) suppository 10 mg  10 mg Rectal Daily PRN Idelle Crouch, MD      . cloNIDine (CATAPRES) tablet 0.2 mg  0.2 mg Oral TID Idelle Crouch,  MD   0.2 mg at 03/15/16 1644  . docusate sodium (COLACE) capsule 100 mg  100 mg Oral BID Idelle Crouch, MD   100 mg at 03/15/16 0957  . heparin injection 5,000 Units  5,000 Units Subcutaneous Q8H Idelle Crouch, MD   5,000 Units at 03/15/16 1326  . HYDROcodone-acetaminophen (NORCO/VICODIN) 5-325 MG per tablet 1-2 tablet  1-2 tablet Oral Q4H PRN Idelle Crouch, MD   2 tablet at 03/14/16 2144  . insulin aspart (novoLOG) injection 0-9 Units  0-9 Units Subcutaneous TID WC Idelle Crouch, MD   3 Units at 03/15/16 1644  . insulin aspart protamine- aspart (NOVOLOG MIX 70/30) injection 8 Units  8 Units Subcutaneous BID WC Loletha Grayer, MD   8 Units at 03/15/16 1644  . ondansetron (ZOFRAN) tablet 4 mg  4 mg Oral Q6H PRN  Idelle Crouch, MD       Or  . ondansetron [place]) injection 4 mg  4 mg Intravenous Q6H PRN Idelle Crouch, MD   4 mg at 03/13/16 0133  . predniSONE (DELTASONE) tablet 30 mg  30 mg Oral Q breakfast Loletha Grayer, MD   30 mg at 03/15/16 0751  . sodium bicarbonate tablet 1,300 mg  1,300 mg Oral BID Murlean Iba, MD   1,300 mg at 03/15/16 0957  . sodium chloride flush (NS) 0.9 % injection 3 mL  3 mL Intravenous Q12H Idelle Crouch, MD   3 mL at 03/14/16 0947      .  PHYSICAL EXAMINATION:   Filed Vitals:   03/15/16 1129 03/15/16 1540  BP: 125/70   Pulse: 64 64  Temp:    Resp: 18    Filed Weights   03/13/16 0318 03/14/16 0422 03/15/16 0440  Weight: 279 lb 8 oz (126.78 kg) 285 lb 1.6 oz (129.321 kg) 287 lb 6.4 oz (130.364 kg)    GENERAL: Well-nourished well-developed; Alert, no distress and comfortable.  Alone. EYES: no pallor or icterus OROPHARYNX: no thrush or ulceration; good dentition  NECK: supple, no masses felt LYMPH:  no palpable lymphadenopathy in the cervical, axillary or inguinal regions LUNGS: clear to auscultation and  No wheeze or crackles HEART/CVS: regular rate & rhythm and no murmurs; No lower extremity edema ABDOMEN: abdomen soft, non-tender and normal bowel sounds Musculoskeletal:no cyanosis of digits and no clubbing  PSYCH: alert & oriented x 3 with fluent speech NEURO: no focal motor/sensory deficits SKIN:  no rashes or significant lesions  LABORATORY DATA:  I have reviewed the data as listed Lab Results  Component Value Date   WBC 13.7* 03/13/2016   HGB 11.1* 03/13/2016   HCT 34.2* 03/13/2016   MCV 79.4* 03/13/2016   PLT 256 03/13/2016    Recent Labs  03/12/16 2015 03/13/16 0419 03/14/16 0428 03/15/16 0426  NA 139 140 137 138  K 5.3* 4.9 5.2* 5.0  CL 106 109 107 109  CO2 18* 20* 20* 22  GLUCOSE 200* 203* 212* 170*  BUN 84* 78* 79* 88*  CREATININE 5.01* 4.77* 4.38* 4.05*  CALCIUM 10.1 9.4 9.4 9.2  GFRNONAA 12* 12* 14* 15*   GFRAA 14* 14* 16* 18*  PROT 8.7* 7.7  --   --   ALBUMIN 3.7 3.2*  --   --   AST 35 27  --   --   ALT 31 26  --   --   ALKPHOS 64 55  --   --   BILITOT 0.8 0.5  --   --  RADIOGRAPHIC STUDIES: I have personally reviewed the radiological images as listed and agreed with the findings in the report. Dg Chest 2 View  03/12/2016  CLINICAL DATA:  55 year old male with left-sided chest pain EXAM: CHEST  2 VIEW COMPARISON:  None. FINDINGS: The heart size and mediastinal contours are within normal limits. Both lungs are clear. The visualized skeletal structures are unremarkable. IMPRESSION: No active cardiopulmonary disease. Electronically Signed   By: Anner Crete M.D.   On: 03/12/2016 22:47   US Renal  03/13/2016  CLINICAL DATA:  Acute renal failure EXAM: RENAL / URINARY TRACT ULTRASOUND COMPLETE COMPARISON:  None. FINDINGS: Right Kidney: Length: 11.0 cm. Mild increased parenchymal echogenicity. No mass or stone. No hydronephrosis. Left Kidney: Length: 11.8 cm. Echogenicity within normal limits. No mass or hydronephrosis visualized. Bladder: Appears normal for degree of bladder distention. IMPRESSION: 1. No acute finding.  No hydronephrosis. 2. Mild increase right renal parenchymal echogenicity suggesting medical renal disease. No other abnormality. Electronically Signed   By: Lajean Manes M.D.   On: 03/13/2016 16:16   Dg Knee 2 Views Right  03/13/2016  CLINICAL DATA:  Right knee pain. States he fell on Friday, but was having pain previous to fall. No surgeries. EXAM: RIGHT KNEE - 3 VIEW COMPARISON:  None. FINDINGS: There is no evidence of fracture, dislocation, or joint effusion. Traction spurs from the patella at the insertion of the quadriceps tendon, and from the tibial tuberosity. There is no evidence of arthropathy or other focal bone abnormality. Soft tissues are unremarkable. IMPRESSION: No acute abnormality. Electronically Signed   By: Lucrezia Europe M.D.   On: 03/13/2016 10:34     ASSESSMENT & PLAN:   # 55 year old male patient with a history of diabetes hypertension; unknown base line kidney function- currently admitted to hospital for acute faint failure; workup shows monoclonal gammopathy.  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 Check 24-hour urine protein /M protein. Highly doubt if patient's renal insufficiency is related to monoclonal gammopathy. Check skeletal survey.  # Acute renal failure- currently making urine/ 2300 yesterday. Creatinine slightly improving from 5 to currently 4. As per nephrology.   Thank you Dr. Candiss Norse for allowing me to participate in the care of your pleasant patient. Please do not hesitate to contact me with questions or concerns in the interim.    Cammie Sickle, MD 03/15/2016 5:14 PM

## 2016-03-16 LAB — GLUCOSE, CAPILLARY: Glucose-Capillary: 99 mg/dL (ref 65–99)

## 2016-03-16 LAB — BASIC METABOLIC PANEL
ANION GAP: 9 (ref 5–15)
BUN: 90 mg/dL — ABNORMAL HIGH (ref 6–20)
CHLORIDE: 108 mmol/L (ref 101–111)
CO2: 22 mmol/L (ref 22–32)
Calcium: 9.2 mg/dL (ref 8.9–10.3)
Creatinine, Ser: 3.67 mg/dL — ABNORMAL HIGH (ref 0.61–1.24)
GFR calc Af Amer: 20 mL/min — ABNORMAL LOW (ref >60)
GFR calc non Af Amer: 17 mL/min — ABNORMAL LOW (ref >60)
GLUCOSE: 129 mg/dL — AB (ref 65–99)
POTASSIUM: 4.6 mmol/L (ref 3.5–5.1)
Sodium: 139 mmol/L (ref 135–145)

## 2016-03-16 MED ORDER — ALLOPURINOL 100 MG PO TABS: 50.0000 mg | ORAL_TABLET | Freq: Every day | ORAL | Status: DC

## 2016-03-16 MED ORDER — PREDNISONE 5 MG PO TABS: ORAL_TABLET | ORAL | Status: DC

## 2016-03-16 MED ORDER — COLCRYS 0.6 MG PO TABS: 0.6000 mg | ORAL_TABLET | ORAL | Status: DC

## 2016-03-16 MED ORDER — NOVOLOG MIX 70/30 (70-30) 100 UNIT/ML ~~LOC~~ SUSP: 8.0000 [IU] | Freq: Two times a day (BID) | SUBCUTANEOUS | Status: DC

## 2016-03-16 MED ORDER — SODIUM BICARBONATE 650 MG PO TABS: 1300.0000 mg | ORAL_TABLET | Freq: Two times a day (BID) | ORAL | Status: DC

## 2016-03-16 MED FILL — Insulin Aspart Prot & Aspart (Human) Inj 100 Unit/ML (70-30): SUBCUTANEOUS | Qty: 0.08 | Status: AC

## 2016-03-16 NOTE — Discharge Instructions (Signed)
Acute Kidney Injury °Acute kidney injury is any condition in which there is sudden (acute) damage to the kidneys. Acute kidney injury was previously known as acute kidney failure or acute renal failure. The kidneys are two organs that lie on either side of the spine between the middle of the back and the front of the abdomen. The kidneys: °· Remove wastes and extra water from the blood.   °· Produce important hormones. These help keep bones strong, regulate blood pressure, and help create red blood cells.   °· Balance the fluids and chemicals in the blood and tissues. °A small amount of kidney damage may not cause problems, but a large amount of damage may make it difficult or impossible for the kidneys to work the way they should. Acute kidney injury may develop into long-lasting (chronic) kidney disease. It may also develop into a life-threatening disease called end-stage kidney disease. Acute kidney injury can get worse very quickly, so it should be treated right away. Early treatment may prevent other kidney diseases from developing. °CAUSES  °· A problem with blood flow to the kidneys. This may be caused by:   °¨ Blood loss.   °¨ Heart disease.   °¨ Severe burns.   °¨ Liver disease. °· Direct damage to the kidneys. This may be caused by: °¨ Some medicines.   °¨ A kidney infection.   °¨ Poisoning or consuming toxic substances.   °¨ A surgical wound.   °¨ A blow to the kidney area.   °· A problem with urine flow. This may be caused by:   °¨ Cancer.   °¨ Kidney stones.   °¨ An enlarged prostate. °SIGNS AND SYMPTOMS  °· Swelling (edema) of the legs, ankles, or feet.   °· Tiredness (lethargy).   °· Nausea or vomiting.   °· Confusion.   °· Problems with urination, such as:   °¨ Painful or burning feeling during urination.   °¨ Decreased urine production.   °¨ Frequent accidents in children who are potty trained.   °¨ Bloody urine.   °· Muscle twitches and cramps.   °· Shortness of breath.   °· Seizures.   °· Chest  pain or pressure. °Sometimes, no symptoms are present.  °DIAGNOSIS °Acute kidney injury may be detected and diagnosed by tests, including blood, urine, imaging, or kidney biopsy tests.  °TREATMENT °Treatment of acute kidney injury varies depending on the cause and severity of the kidney damage. In mild cases, no treatment may be needed. The kidneys may heal on their own. If acute kidney injury is more severe, your health care provider will treat the cause of the kidney damage, help the kidneys heal, and prevent complications from occurring. Severe cases may require a procedure to remove toxic wastes from the body (dialysis) or surgery to repair kidney damage. Surgery may involve:  °· Repair of a torn kidney.   °· Removal of an obstruction. °HOME CARE INSTRUCTIONS °· Follow your prescribed diet. °· Take medicines only as directed by your health care provider.  °· Do not take any new medicines (prescription, over-the-counter, or nutritional supplements) unless approved by your health care provider. Many medicines can worsen your kidney damage or may need to have the dose adjusted.   °· Keep all follow-up visits as directed by your health care provider. This is important. °· Observe your condition to make sure you are healing as expected. °SEEK IMMEDIATE MEDICAL CARE IF: °· You are feeling ill or have severe pain in the back or side.   °· Your symptoms return or you have new symptoms. °· You have any symptoms of end-stage kidney disease. These include:   °¨ Persistent itchiness.   °¨   Loss of appetite.   °¨ Headaches.   °¨ Abnormally dark or light skin. °¨ Numbness in the hands or feet.   °¨ Easy bruising.   °¨ Frequent hiccups.   °¨ Menstruation stops.   °· You have a fever. °· You have increased urine production. °· You have pain or bleeding when urinating. °MAKE SURE YOU:  °· Understand these instructions. °· Will watch your condition. °· Will get help right away if you are not doing well or get worse. °  °This  information is not intended to replace advice given to you by your health care provider. Make sure you discuss any questions you have with your health care provider. °  °Document Released: 05/01/2011 Document Revised: 11/06/2014 Document Reviewed: 06/14/2012 °Elsevier Interactive Patient Education ©2016 [employer] ° °

## 2016-03-16 NOTE — Discharge Summary (Signed)
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 Diabetes mellitus without complication ([place])   . Hypertension   . Hyperlipemia   . Morbid obesity ([place])   . Gout     HOSPITAL COURSE:   1. Acute kidney injury. Unknown baseline kidney function. Renal ultrasound showing medical renal disease. The patient was given IV fluids during the entire hospital course. We held nephrotoxic medication including ibuprofen, Lasix, hyGroton and lisinopril. Creatinine was 5.01 upon admission and improved to 3.67 upon discharge home. He will follow-up with nephrology next week to check his kidney function. Unclear what his baseline kidney function is at this point. 2. Right knee pain and fever at home. This is an acute gout attack. The patient received IV Rocephin while here for fever but I wasn't sure when I was treating and I stopped antibiotics. 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 I gave Solu-Medrol and then prednisone and we'll give a prednisone taper upon going home. His uric acid was elevated above 14. Low-dose allopurinol can be started as outpatient at 50 mg daily. Colchicine can be restarted at one pill every 3 days 3. Nausea vomiting on presentation has improved 4. Essential hypertension  continue clonidine and Norvasc 5. Hyperlipidemia unspecified on simvastatin 6. Type 2 diabetes without complication. Hemoglobin A1c elevated a 0.5. I restarted low-dose 70/30 insulin at 8 units twice a day 7. Slightly elevated troponin. This is likely a false positive with acute renal failure. No chest pain or shortness of breath. No further workup. 8. Monoclonal gammopathy of undetermined significance. Follow-up as outpatient. A 24 urine can be done as outpatient.  DISCHARGE CONDITIONS:   Satisfactory  CONSULTS OBTAINED:  Treatment Team:  Wellington Hampshire, MD Murlean Iba, MD Cammie Sickle, MD  DRUG ALLERGIES:  No Known Allergies  DISCHARGE MEDICATIONS:   Discharge Medication List as of 03/16/2016 10:31 AM    START taking these medications   Details  allopurinol (ZYLOPRIM) 100 MG tablet Take 0.5 tablets (50 mg total) by mouth daily., Starting 03/23/2016, Until Discontinued, Print    predniSONE (DELTASONE) 5 MG tablet 3 tabs po day1; 2 tabs po day2; 1 tabs po day3; 1/2  tab po day4, 5, Print    sodium bicarbonate 650 MG tablet Take 2 tablets (1,300 mg total) by mouth 2 (two) times daily., Starting 03/16/2016, Until Discontinued, Print      CONTINUE these medications which have CHANGED   Details  COLCRYS 0.6 MG tablet Take 1 tablet (0.6 mg total) by mouth every 3 (three) days., Starting 03/16/2016, Until Discontinued, Print    NOVOLOG MIX 70/30 (70-30) 100 UNIT/ML injection Inject 0.08 mLs (8 Units total) into the skin 2 (two) times daily with a meal., Starting 03/16/2016, Until Discontinued, No Print  CONTINUE these medications which have NOT CHANGED   Details  amLODipine (NORVASC) 10 MG tablet Take 1 tablet by mouth daily., Starting 02/29/2016, Until Discontinued, Historical Med    cloNIDine (CATAPRES) 0.2 MG tablet Take 1 tablet by mouth 3 (three) times daily., Starting 02/29/2016, Until Discontinued, Historical Med    simvastatin (ZOCOR) 20 MG tablet Take 1 tablet  by mouth daily., Starting 02/29/2016, Until Discontinued, Historical Med      STOP taking these medications     chlorthalidone (HYGROTON) 25 MG tablet      furosemide (LASIX) 20 MG tablet      lisinopril (PRINIVIL,ZESTRIL) 10 MG tablet          DISCHARGE INSTRUCTIONS:   Follow-up one week Dr. Candiss Norse nephrology Follow-up PMD 2 weeks  If you experience worsening of your admission symptoms, develop shortness of breath, life threatening emergency, suicidal or homicidal thoughts you must seek medical attention immediately by calling 911 or calling your MD immediately  if symptoms less severe.  You Must read complete instructions/literature along with all the possible adverse reactions/side effects for all the Medicines you take and that have been prescribed to you. Take any new Medicines after you have completely understood and accept all the possible adverse reactions/side effects.   Please note  You were cared for by a hospitalist during your hospital stay. If you have any questions about your discharge medications or the care you received while you were in the hospital after you are discharged, you can call the unit and asked to speak with the hospitalist on call if the hospitalist that took care of you is not available. Once you are discharged, your primary care physician will handle any further medical issues. Please note that NO REFILLS for any discharge medications will be authorized once you are discharged, as it is imperative that you return to your primary care physician (or establish a relationship with a primary care physician if you do not have one) for your aftercare needs so that they can reassess your need for medications and monitor your lab values.    Today   CHIEF COMPLAINT:   Chief Complaint  Patient presents with  . Nausea  . Emesis  . Knee Pain    HISTORY OF PRESENT ILLNESS:  Caleb Hughes  is a 55 y.o. male presents with nausea vomiting and right knee pain.  Found to be in acute renal failure.   VITAL SIGNS:  Blood pressure 144/85, pulse 57, temperature 98.1 F (36.7 C), temperature source Oral, resp. rate 20, height 6' 2"  (1.88 m), weight 126.826 kg (279 lb 9.6 oz), SpO2 98 %.    PHYSICAL EXAMINATION:  GENERAL:  55 y.o.-year-old patient lying in the bed with no acute distress.  EYES: Pupils equal, round, reactive to light and accommodation. No scleral icterus. Extraocular muscles intact.  HEENT: Head atraumatic, normocephalic. Oropharynx and nasopharynx clear.  NECK:  Supple, no jugular venous distention. No thyroid enlargement, no tenderness.  LUNGS: Normal breath sounds bilaterally, no wheezing, rales,rhonchi or crepitation. No use of accessory muscles of respiration.  CARDIOVASCULAR: S1, S2 normal. No murmurs, rubs, or gallops.  ABDOMEN: Soft, non-tender, non-distended. Bowel sounds present. No organomegaly or mass.  EXTREMITIES: No pedal edema, cyanosis, or clubbing. Good range of motion right knee only with slight pain. NEUROLOGIC: Cranial nerves II through XII are intact. Muscle strength 5/5 in all extremities. Sensation intact. Gait not checked.  PSYCHIATRIC: The patient is alert and oriented x 3.  SKIN: No obvious rash, lesion,  or ulcer.   DATA REVIEW:   CBC  Recent Labs Lab 03/13/16 0419  WBC 13.7*  HGB 11.1*  HCT 34.2*  PLT 256    Chemistries   Recent Labs Lab 03/13/16 0419  03/16/16 0457  NA 140  < > 139  K 4.9  < > 4.6  CL 109  < > 108  CO2 20*  < > 22  GLUCOSE 203*  < > 129*  BUN 78*  < > 90*  CREATININE 4.77*  < > 3.67*  CALCIUM 9.4  < > 9.2  AST 27  --   --   ALT 26  --   --   ALKPHOS 55  --   --   BILITOT 0.5  --   --   < > = values in this interval not displayed.  Cardiac Enzymes  Recent Labs Lab 03/13/16 1425  TROPONINI 0.03    RADIOLOGY:  Dg Bone Survey Met  03/15/2016  CLINICAL DATA:  Monoclonal gammopathy, lymphadenopathy, LEFT knee pain, hypertension, diabetes mellitus, morbid  obesity EXAM: METASTATIC BONE SURVEY COMPARISON:  Chest radiograph 03/12/2016, RIGHT knee radiographs 03/13/2016 FINDINGS: Minimally prominent heart size likely accentuated by AP technique. Minimal subsegmental atelectasis lingula. Scattered endplate spur formation thoracic spine. Osseous mineralization normal. Metallic foreign body projects over the temporal bones on lateral view. No lytic or destructive bone lesions identified. No abnormal areas of osseous sclerosis or new/old fractures. IMPRESSION: Degenerative changes of the thoracic spine. Otherwise negative metastatic bone survey. Electronically Signed   By: Lavonia Dana M.D.   On: 03/15/2016 19:28   Management plans discussed with the patient, family and they are in agreement.  CODE STATUS:  Code Status History    Date Active Date Inactive Code Status Order ID Comments User Context   03/13/2016  3:12 AM 03/16/2016  2:11 PM Full Code [id number]  Idelle Crouch, MD Inpatient      TOTAL TIME TAKING CARE OF THIS PATIENT: 35 minutes.    Loletha Grayer M.D on 03/16/2016 at 3:50 PM  Between 7am to 6pm - Pager - [phone]  After 6pm go to [email or web address] - Proofreader  [facility]  [phone]  CC: Primary care physician; [place]

## 2016-03-16 NOTE — Progress Notes (Signed)
Subjective:  Overall doing fair Serum creatinine only slightly improved to 3.67/GFR 20 Urine output good Urine protein/creatinine ratio as 3.55 gm Serology studies are  Negative Small M Spike noted in urine and blood Patient is able to eat without nausea or vomiting   Objective:  Vital signs in last 24 hours:  Temp:  [98.1 F (36.7 C)] 98.1 F (36.7 C) (05/18 0518) Pulse Rate:  [57-66] 57 (05/18 0518) Resp:  [20] 20 (05/18 0518) BP: (142-144)/(68-85) 144/85 mmHg (05/18 0518) SpO2:  [97 %-100 %] 98 % (05/18 0518) Weight:  [126.826 kg (279 lb 9.6 oz)] 126.826 kg (279 lb 9.6 oz) (05/18 0518)  Weight change: -3.538 kg (-7 lb 12.8 oz) Filed Weights   03/14/16 0422 03/15/16 0440 03/16/16 0518  Weight: 129.321 kg (285 lb 1.6 oz) 130.364 kg (287 lb 6.4 oz) 126.826 kg (279 lb 9.6 oz)    Intake/Output:    Intake/Output Summary (Last 24 hours) at 03/16/16 1232 Last data filed at 03/16/16 1037  Gross per 24 hour  Intake 3197.5 ml  Output   2950 ml  Net  247.5 ml     Physical Exam: General: No acute distress, lying in the bed  HEENT Anicteric, moist mucous membranes  Neck supple  Pulm/lungs Normal effort, clear to auscultation bilaterally  CVS/Heart Regular, no rub or gallop  Abdomen:  Soft, nontender  Extremities: No peripheral edema, right knee swelling  Neurologic: Alert, oriented  Skin: No acute rashes          Basic Metabolic Panel:   Recent Labs Lab 03/12/16 2015 03/13/16 0419 03/13/16 1425 03/14/16 0428 03/15/16 0426 03/16/16 0457  NA 139 140  --  137 138 139  K 5.3* 4.9  --  5.2* 5.0 4.6  CL 106 109  --  107 109 108  CO2 18* 20*  --  20* 22 22  GLUCOSE 200* 203*  --  212* 170* 129*  BUN 84* 78*  --  79* 88* 90*  CREATININE 5.01* 4.77*  --  4.38* 4.05* 3.67*  CALCIUM 10.1 9.4  --  9.4 9.2 9.2  PHOS  --   --  4.3  --   --   --      CBC:  Recent Labs Lab 03/12/16 2015 03/13/16 0419  WBC 16.4* 13.7*  HGB 12.0* 11.1*  HCT 38.0* 34.2*  MCV  80.1 79.4*  PLT 281 256      Microbiology:  No results found for this or any previous visit (from the past 720 hour(s)).  Coagulation Studies: No results for input(s): LABPROT, INR in the last 72 hours.  Urinalysis: No results for input(s): COLORURINE, LABSPEC, PHURINE, GLUCOSEU, HGBUR, BILIRUBINUR, KETONESUR, PROTEINUR, UROBILINOGEN, NITRITE, LEUKOCYTESUR in the last 72 hours.  Invalid input(s): APPERANCEUR    Imaging: Dg Bone Survey Met  03/15/2016  CLINICAL DATA:  Monoclonal gammopathy, lymphadenopathy, LEFT knee pain, hypertension, diabetes mellitus, morbid obesity EXAM: METASTATIC BONE SURVEY COMPARISON:  Chest radiograph 03/12/2016, RIGHT knee radiographs 03/13/2016 FINDINGS: Minimally prominent heart size likely accentuated by AP technique. Minimal subsegmental atelectasis lingula. Scattered endplate spur formation thoracic spine. Osseous mineralization normal. Metallic foreign body projects over the temporal bones on lateral view. No lytic or destructive bone lesions identified. No abnormal areas of osseous sclerosis or new/old fractures. IMPRESSION: Degenerative changes of the thoracic spine. Otherwise negative metastatic bone survey. Electronically Signed   By: Lavonia Dana M.D.   On: 03/15/2016 19:28     Medications:     . amLODipine  10  mg Oral Daily  . aspirin EC  81 mg Oral Daily  . cloNIDine  0.2 mg Oral TID  . docusate sodium  100 mg Oral BID  . heparin  5,000 Units Subcutaneous Q8H  . insulin aspart  0-9 Units Subcutaneous TID WC  . insulin aspart protamine- aspart  8 Units Subcutaneous BID WC  . predniSONE  30 mg Oral Q breakfast  . sodium bicarbonate  1,300 mg Oral BID  . sodium chloride flush  3 mL Intravenous Q12H   acetaminophen **OR** acetaminophen, bisacodyl, HYDROcodone-acetaminophen, ondansetron **OR** ondansetron (ZOFRAN) IV  Assessment/ Plan:  55 y.o. male male with a PMHX of medical problems of Long-standing diabetes type 2 with neuropathy,  hypertension, hyperlipidemia, obesity, schizophrenia, gout , was admitted on 03/12/2016 with ARF.   1. 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 Metabolic acidosis Likely secondary to renal failure Continue sodium bicarbonate  3. DM-2 with CKD HbA1c 8.5% Hold off ace-i until creatinine stabilizes   LOS: 4 Evalin Shawhan 5/18/201712:32 PM

## 2016-03-16 NOTE — Care Management (Signed)
Patient admitted from home with AKI.  Patient lives at home with his son.  Patient states that that his sister provides transportation and will be picking him up today for discharge.  Patient states that he has a cane and walker in the home for ambulation.  Patient denies any financial concerns obtaining medications.  PT has recommended SNF. Patient has declined. MD to place home health orders at time of discharge.  Patient was provided with agency list and [facility] was selected.  Corene Cornea with [facility] notified of referral.  RNCM signing off.

## 2016-03-16 NOTE — Progress Notes (Signed)
Discharge instructions given. IV and tele removed. Patient's sister is down at the visitors entrance now to pick him up. Home health has been set up per care management. Education given on all new meds as well as renal failure and gout along with diet education. Patient has no questions at this time. Will make follow up appointments.

## 2016-03-16 NOTE — Progress Notes (Signed)
Per Dr. Leslye Peer, d/c 24 hour urine collection due to patient being discharged. Collection was not started until 8PM last night, so it is not complete. Dr. Candiss Norse will do collection outpatient.

## 2016-03-17 LAB — KAPPA/LAMBDA LIGHT CHAINS
KAPPA FREE LGHT CHN: 58.62 mg/L — AB (ref 3.30–19.40)
Kappa, lambda light chain ratio: 1.82 — ABNORMAL HIGH (ref 0.26–1.65)
LAMDA FREE LIGHT CHAINS: 32.28 mg/L — AB (ref 5.71–26.30)

## 2016-03-20 LAB — MULTIPLE MYELOMA PANEL, SERUM
ALBUMIN/GLOB SERPL: 0.9 (ref 0.7–1.7)
ALPHA 1: 0.4 g/dL (ref 0.0–0.4)
Albumin SerPl Elph-Mcnc: 2.8 g/dL — ABNORMAL LOW (ref 2.9–4.4)
Alpha2 Glob SerPl Elph-Mcnc: 1 g/dL (ref 0.4–1.0)
B-Globulin SerPl Elph-Mcnc: 1.1 g/dL (ref 0.7–1.3)
Gamma Glob SerPl Elph-Mcnc: 0.9 g/dL (ref 0.4–1.8)
Globulin, Total: 3.5 g/dL (ref 2.2–3.9)
IGA: 260 mg/dL (ref 90–386)
IGM, SERUM: 95 mg/dL (ref 20–172)
IgG (Immunoglobin G), Serum: 1054 mg/dL (ref 700–1600)
Total Protein ELP: 6.3 g/dL (ref 6.0–8.5)

## 2016-06-12 ENCOUNTER — Encounter: Payer: Self-pay | Admitting: *Deleted

## 2016-06-12 ENCOUNTER — Inpatient Hospital Stay
Admission: EM | Admit: 2016-06-12 | Discharge: 2016-06-15 | DRG: 684 | Disposition: A | Discharge status: Home / Self Care | Payer: Medicare Other | Attending: Specialist | Admitting: Specialist

## 2016-06-12 ENCOUNTER — Emergency Department: Payer: Medicare Other

## 2016-06-12 DIAGNOSIS — Z6834 Body mass index (BMI) 34.0-34.9, adult: Secondary | ICD-10-CM

## 2016-06-12 DIAGNOSIS — J189 Pneumonia, unspecified organism: Secondary | ICD-10-CM | POA: Diagnosis present

## 2016-06-12 DIAGNOSIS — M109 Gout, unspecified: Secondary | ICD-10-CM | POA: Diagnosis present

## 2016-06-12 DIAGNOSIS — D631 Anemia in chronic kidney disease: Secondary | ICD-10-CM | POA: Diagnosis present

## 2016-06-12 DIAGNOSIS — R109 Unspecified abdominal pain: Secondary | ICD-10-CM

## 2016-06-12 DIAGNOSIS — N184 Chronic kidney disease, stage 4 (severe): Secondary | ICD-10-CM | POA: Diagnosis present

## 2016-06-12 DIAGNOSIS — E1122 Type 2 diabetes mellitus with diabetic chronic kidney disease: Secondary | ICD-10-CM | POA: Diagnosis present

## 2016-06-12 DIAGNOSIS — Z833 Family history of diabetes mellitus: Secondary | ICD-10-CM | POA: Diagnosis not present

## 2016-06-12 DIAGNOSIS — N179 Acute kidney failure, unspecified: Principal | ICD-10-CM | POA: Diagnosis present

## 2016-06-12 DIAGNOSIS — R0789 Other chest pain: Secondary | ICD-10-CM | POA: Diagnosis present

## 2016-06-12 DIAGNOSIS — Z841 Family history of disorders of kidney and ureter: Secondary | ICD-10-CM | POA: Diagnosis not present

## 2016-06-12 DIAGNOSIS — E785 Hyperlipidemia, unspecified: Secondary | ICD-10-CM | POA: Diagnosis present

## 2016-06-12 DIAGNOSIS — Z794 Long term (current) use of insulin: Secondary | ICD-10-CM

## 2016-06-12 DIAGNOSIS — Z8249 Family history of ischemic heart disease and other diseases of the circulatory system: Secondary | ICD-10-CM | POA: Diagnosis not present

## 2016-06-12 DIAGNOSIS — I959 Hypotension, unspecified: Secondary | ICD-10-CM | POA: Diagnosis present

## 2016-06-12 DIAGNOSIS — I129 Hypertensive chronic kidney disease with stage 1 through stage 4 chronic kidney disease, or unspecified chronic kidney disease: Secondary | ICD-10-CM | POA: Diagnosis present

## 2016-06-12 LAB — LIPASE, BLOOD: Lipase: 27 U/L (ref 11–51)

## 2016-06-12 LAB — CBC WITH DIFFERENTIAL/PLATELET
Basophils Absolute: 0.1 10*3/uL (ref 0–0.1)
Basophils Relative: 1 %
Eosinophils Absolute: 0.2 10*3/uL (ref 0–0.7)
Eosinophils Relative: 3 %
HEMATOCRIT: 41.4 % (ref 40.0–52.0)
HEMOGLOBIN: 13.3 g/dL (ref 13.0–18.0)
LYMPHS ABS: 1.5 10*3/uL (ref 1.0–3.6)
LYMPHS PCT: 16 %
MCH: 25.1 pg — AB (ref 26.0–34.0)
MCHC: 32.2 g/dL (ref 32.0–36.0)
MCV: 77.8 fL — ABNORMAL LOW (ref 80.0–100.0)
MONOS PCT: 17 %
Monocytes Absolute: 1.6 10*3/uL — ABNORMAL HIGH (ref 0.2–1.0)
NEUTROS ABS: 6.1 10*3/uL (ref 1.4–6.5)
NEUTROS PCT: 63 %
Platelets: 184 10*3/uL (ref 150–440)
RBC: 5.32 MIL/uL (ref 4.40–5.90)
RDW: 15.3 % — ABNORMAL HIGH (ref 11.5–14.5)
WBC: 9.4 10*3/uL (ref 3.8–10.6)

## 2016-06-12 LAB — HEPATIC FUNCTION PANEL
ALK PHOS: 55 U/L (ref 38–126)
ALT: 12 U/L — AB (ref 17–63)
AST: 16 U/L (ref 15–41)
Albumin: 4.4 g/dL (ref 3.5–5.0)
Total Bilirubin: 0.4 mg/dL (ref 0.3–1.2)
Total Protein: 8.4 g/dL — ABNORMAL HIGH (ref 6.5–8.1)

## 2016-06-12 LAB — BASIC METABOLIC PANEL
ANION GAP: 10 (ref 5–15)
BUN: 87 mg/dL — ABNORMAL HIGH (ref 6–20)
CO2: 19 mmol/L — AB (ref 22–32)
Calcium: 9.5 mg/dL (ref 8.9–10.3)
Chloride: 108 mmol/L (ref 101–111)
Creatinine, Ser: 4.37 mg/dL — ABNORMAL HIGH (ref 0.61–1.24)
GFR calc Af Amer: 16 mL/min — ABNORMAL LOW (ref >60)
GFR calc non Af Amer: 14 mL/min — ABNORMAL LOW (ref >60)
GLUCOSE: 162 mg/dL — AB (ref 65–99)
POTASSIUM: 4 mmol/L (ref 3.5–5.1)
Sodium: 137 mmol/L (ref 135–145)

## 2016-06-12 LAB — CK: Total CK: 792 U/L — ABNORMAL HIGH (ref 49–397)

## 2016-06-12 LAB — TROPONIN I: Troponin I: <0.03 ng/mL (ref <0.03)

## 2016-06-12 LAB — BRAIN NATRIURETIC PEPTIDE: B Natriuretic Peptide: 11 pg/mL (ref 0.0–100.0)

## 2016-06-12 MED ORDER — COLCHICINE 0.6 MG PO TABS
0.6000 mg | ORAL_TABLET | ORAL | Status: DC
  Administered 2016-06-13: 0.6 mg via ORAL
  Filled 2016-06-12: qty 1

## 2016-06-12 MED ORDER — SIMVASTATIN 20 MG PO TABS
20.0000 mg | ORAL_TABLET | Freq: Every day | ORAL | Status: DC
  Administered 2016-06-13 – 2016-06-15 (×3): 20 mg via ORAL
  Filled 2016-06-12 (×3): qty 1

## 2016-06-12 MED ORDER — ACETAMINOPHEN 325 MG PO TABS: 650.0000 mg | ORAL_TABLET | Freq: Four times a day (QID) | ORAL | Status: DC | PRN

## 2016-06-12 MED ORDER — ONDANSETRON HCL 4 MG PO TABS: 4.0000 mg | ORAL_TABLET | Freq: Four times a day (QID) | ORAL | Status: DC | PRN

## 2016-06-12 MED ORDER — IPRATROPIUM-ALBUTEROL 0.5-2.5 (3) MG/3ML IN SOLN: 3.0000 mL | Freq: Four times a day (QID) | RESPIRATORY_TRACT | Status: DC | PRN

## 2016-06-12 MED ORDER — CHLORTHALIDONE 25 MG PO TABS
25.0000 mg | ORAL_TABLET | Freq: Two times a day (BID) | ORAL | Status: DC
  Administered 2016-06-13: 25 mg via ORAL
  Filled 2016-06-12: qty 1

## 2016-06-12 MED ORDER — KETOROLAC TROMETHAMINE 60 MG/2ML IM SOLN: 60.0000 mg | Freq: Once | INTRAMUSCULAR | Status: DC

## 2016-06-12 MED ORDER — FUROSEMIDE 20 MG PO TABS: 20.0000 mg | ORAL_TABLET | Freq: Two times a day (BID) | ORAL | Status: DC

## 2016-06-12 MED ORDER — INSULIN ASPART 100 UNIT/ML ~~LOC~~ SOLN: 0.0000 [IU] | Freq: Three times a day (TID) | SUBCUTANEOUS | Status: DC

## 2016-06-12 MED ORDER — PIPERACILLIN-TAZOBACTAM 3.375 G IVPB 30 MIN
3.3750 g | Freq: Once | INTRAVENOUS | Status: AC
  Administered 2016-06-12: 3.375 g via INTRAVENOUS
  Filled 2016-06-12: qty 50

## 2016-06-12 MED ORDER — TRAMADOL HCL 50 MG PO TABS
50.0000 mg | ORAL_TABLET | Freq: Once | ORAL | Status: AC
  Administered 2016-06-12: 50 mg via ORAL

## 2016-06-12 MED ORDER — ACETAMINOPHEN 650 MG RE SUPP: 650.0000 mg | Freq: Four times a day (QID) | RECTAL | Status: DC | PRN

## 2016-06-12 MED ORDER — AMLODIPINE BESYLATE 10 MG PO TABS: 10.0000 mg | ORAL_TABLET | Freq: Every day | ORAL | Status: DC

## 2016-06-12 MED ORDER — CLONIDINE HCL 0.1 MG PO TABS
0.2000 mg | ORAL_TABLET | Freq: Three times a day (TID) | ORAL | Status: DC
  Administered 2016-06-13: 0.2 mg via ORAL
  Filled 2016-06-12: qty 2

## 2016-06-12 MED ORDER — ENOXAPARIN SODIUM 30 MG/0.3ML ~~LOC~~ SOLN
30.0000 mg | SUBCUTANEOUS | Status: DC
  Administered 2016-06-13: 30 mg via SUBCUTANEOUS
  Filled 2016-06-12: qty 0.3

## 2016-06-12 MED ORDER — ONDANSETRON HCL 4 MG/2ML IJ SOLN: 4.0000 mg | Freq: Four times a day (QID) | INTRAMUSCULAR | Status: DC | PRN

## 2016-06-12 MED ORDER — SODIUM CHLORIDE 0.9 % IV SOLN
INTRAVENOUS | Status: DC
  Administered 2016-06-13 – 2016-06-14 (×4): via INTRAVENOUS

## 2016-06-12 MED ORDER — TRAMADOL HCL 50 MG PO TABS
ORAL_TABLET | ORAL | Status: AC
  Filled 2016-06-12: qty 1

## 2016-06-12 MED ORDER — HYDROCODONE-ACETAMINOPHEN 5-325 MG PO TABS
1.0000 | ORAL_TABLET | ORAL | Status: DC | PRN
  Administered 2016-06-13 (×2): 1 via ORAL
  Administered 2016-06-13 – 2016-06-14 (×3): 2 via ORAL
  Filled 2016-06-12: qty 2
  Filled 2016-06-12: qty 1
  Filled 2016-06-12 (×2): qty 2
  Filled 2016-06-12: qty 1

## 2016-06-12 NOTE — ED Provider Notes (Signed)
Time Seen: Approximately 2057  I have reviewed the triage notes  Chief Complaint: Chest Pain   History of Present Illness: Caleb Hughes is a 55 y.o. male who presents with complaints of right-sided chest and abdominal pain. He states his pain started the previous night and hurts him to lie on his right side and also any movement of his right arm. He states the pain radiates down from the chest into the right side of his abdomen. He denies any fever chills nausea vomiting. Patient extremely poor historian based on his medical record he was admitted here in May for renal failure and etiology. Patient states he never followed up with a nephrologist.   Past Medical History:  Diagnosis Date  . Diabetes mellitus without complication ([place])   . Gout   . Hyperlipemia   . Hypertension   . Morbid obesity [facility])     Patient Active Problem List   Diagnosis Date Noted  . 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 Elevated troponin 03/13/2016  . Hyperkalemia 03/13/2016  . Morbid obesity ([place]) 03/13/2016  . Hypertension   . Hyperlipemia   . Gout   . ARF (acute renal failure) ([place]) 03/12/2016  . Dehydration 03/12/2016  . Intractable nausea and vomiting 03/12/2016  . Type II diabetes mellitus with manifestations ([place]) 03/12/2016    History reviewed. No pertinent surgical history.  History reviewed. No pertinent surgical history.  Current Outpatient Rx  . Order #: [id number] Class: Historical Med  . Order #: [id number] Class: Historical Med  . Order #: [id number] Class: Historical Med  . Order #: [id number] Class: Print  . Order #: [id number] Class: Historical Med  . Order #: [id number] Class: Historical Med  . Order #: [id number] Class: No Print  . Order #: [id number] Class: Historical Med  . Order #: [id number] Class: Print  . Order #: [id number] Class: Print  . Order #: [id number] Class: Print    Allergies:  Review of patient's allergies indicates no known  allergies.  Family History: Family History  Problem Relation Age of Onset  . Hypertension Mother   . Diabetes type II Mother   . CAD Father   . Hypertension Father   . Alcohol abuse Father   . Alcohol abuse Brother   . Alcohol abuse Brother   . Kidney failure Brother   . Kidney failure Brother     Social History: Social History  Substance Use Topics  . Smoking status: Never Smoker  . Smokeless tobacco: Never Used  . Alcohol use No     Review of Systems:   10 point review of systems was performed and was otherwise negative:  Constitutional: No fever Eyes: No visual disturbances ENT: No sore throat, ear pain Cardiac: Patient is exclusively right-sided up toward his right shoulder Respiratory: No shortness of breath, wheezing, or stridor Abdomen: No abdominal pain, no vomiting, No diarrhea Endocrine: No weight loss, No night sweats Extremities: Patient states he's had swelling in both of his lower extremities which essentially is unchanged Skin: No rashes, easy bruising Neurologic: No focal weakness, trouble with speech or swollowing Urologic: No dysuria, Hematuria, or urinary frequency   Physical Exam:  ED Triage Vitals  Enc Vitals Group     BP 06/12/16 1858 111/77     Pulse Rate 06/12/16 1858 93     Resp 06/12/16 1858 20     Temp 06/12/16 1858 98.7 F (37.1 C)     Temp Source 06/12/16 1858 Oral     SpO2 06/12/16 1858 96 %  Weight 06/12/16 1856 270 lb (122.5 kg)     Height 06/12/16 1856 6\' 2"  (1.88 m)     Head Circumference --      Peak Flow --      Pain Score 06/12/16 1856 7     Pain Loc --      Pain Edu? --      Excl. in [place]? --     General: Awake , Alert , and Oriented times 3; GCS 15 Head: Normal cephalic , atraumatic Eyes: Pupils equal , round, reactive to light Nose/Throat: No nasal drainage, patent upper airway without erythema or exudate.  Neck: Supple, Full range of motion, No anterior adenopathy or palpable thyroid masses Lungs: Clear to  ascultation without wheezes , rhonchi, or rales Heart: Regular rate, regular rhythm without murmurs , gallops , or rubs Abdomen: Soft, non tender without rebound, guarding , or rigidity; bowel sounds positive and symmetric in all 4 quadrants. No organomegaly .        Extremities:Pain is reproducible across the right shoulder and right upper chest wall region and also toward the right upper quadrant and flank area. There is no rashes or lesions noted. No crepitus or step-off is noted. He has a large amount of pain with trying to raise his right arm up at the side with tenderness at the acromioclavicular joint region Neurologic: normal ambulation, Motor symmetric without deficits, sensory intact Skin: warm, dry, no rashes   Labs:   All laboratory work was reviewed including any pertinent negatives or positives listed below:  Labs Reviewed  BASIC METABOLIC PANEL - Abnormal; Notable for the following:       Result Value   CO2 19 (*)    Glucose, Bld 162 (*)    BUN 87 (*)    Creatinine, Ser 4.37 (*)    GFR calc non Af Amer 14 (*)    GFR calc Af Amer 16 (*)    All other components within normal limits  CBC WITH DIFFERENTIAL/PLATELET - Abnormal; Notable for the following:    MCV 77.8 (*)    MCH 25.1 (*)    RDW 15.3 (*)    Monocytes Absolute 1.6 (*)    All other components within normal limits  CK - Abnormal; Notable for the following:    Total CK 792 (*)    All other components within normal limits  HEPATIC FUNCTION PANEL - Abnormal; Notable for the following:    Total Protein 8.4 (*)    ALT 12 (*)    Bilirubin, Direct <0.1 (*)    All other components within normal limits  CULTURE, BLOOD (ROUTINE X 2)  CULTURE, BLOOD (ROUTINE X 2)  TROPONIN I  LIPASE, BLOOD  BRAIN NATRIURETIC PEPTIDE  Numerous laboratory work abnormalities including a significantly elevated BUN and creatinine. Blood sugar is also elevated at 162  EKG:  ED ECG REPORT I, Daymon Larsen, the attending physician,  personally viewed and interpreted this ECG.  Date: 06/12/2016 EKG Time: 1859 Rate: 103 Rhythm: Sinus tachycardia QRS Axis: normal Intervals: normal ST/T Wave abnormalities: normal Conduction Disturbances: none Narrative Interpretation: unremarkable No ischemic changes   Radiology:  "Dg Chest 2 View  Result Date: 06/12/2016 CLINICAL DATA:  Right-sided chest pain that began last night. EXAM: CHEST  2 VIEW COMPARISON:  Chest radiograph 03/15/2016 FINDINGS: There is shallow lung inflation. Cardiomediastinal contours are normal. No pneumothorax. Possible small right pleural effusion. Bilateral basilar linear opacities compatible subsegmental atelectasis. No focal airspace consolidation. IMPRESSION: Bibasilar subsegmental atelectasis  and possible small right pleural effusion. No focal airspace consolidation. Electronically Signed   By: Ulyses Jarred M.D.   On: 06/12/2016 19:50   Ct Renal Stone Study  Result Date: 06/12/2016 CLINICAL DATA:  RIGHT chest and RIGHT lower quadrant pain since last night. EXAM: CT ABDOMEN AND PELVIS WITHOUT CONTRAST TECHNIQUE: Multidetector CT imaging of the abdomen and pelvis was performed following the standard protocol without IV contrast. COMPARISON:  Renal ultrasound 03/03/2016. FINDINGS: Lower chest: Patchy pulmonary opacities at both lung bases, much greater on the RIGHT. Small RIGHT effusion. Suspected pneumonia. Hepatobiliary: No mass visualized on this un-enhanced exam. Multiple gallstones, both peripherally calcified as well as containing low attenuation material consistent with lipid. No pericholecystic fluid or inflammation of the fat. No biliary ductal dilatation. Pancreas: No mass or inflammatory process identified on this un-enhanced exam. Spleen: Within normal limits in size. Adrenals/Urinary Tract: No evidence of urolithiasis or hydronephrosis. Tiny parenchymal calcifications on the RIGHT not visible on prior ultrasound, likely sequelae of chronic renal  disease. No similar calcifications on the LEFT. Stomach/Bowel: No evidence of obstruction, inflammatory process, or abnormal fluid collections. Vascular/Lymphatic: No pathologically enlarged lymph nodes. No evidence of abdominal aortic aneurysm. Reproductive: No mass or other significant abnormality. Other: Increased body habitus. Musculoskeletal:  No suspicious bone lesions identified. IMPRESSION: RIGHT lung base opacities with pleural fluid, suspected pneumonia. Cholelithiasis without features suggestive of acute cholecystitis. No obstructive uropathy or nephrolithiasis. No bowel inflammatory process. Electronically Signed   By: Staci Righter M.D.   On: 06/12/2016 21:56  "  I personally reviewed the radiologic studies   ED Course:  Given the patient's current clinical presentation and objective findings he appears to have some chronic issues with elevated BUN and creatinine which according to the patient is never followed up on an outpatient basis. He has bilateral infiltrates at the bases with some mild pleural effusion which may be community-acquired pneumonia though is afebrile and his white blood cell count is normal. The patient does not appear to have an elevated BUN and the indicative of pulmonary edema. The patient had blood cultures 2 obtained and was started on Zosyn. History of stent abdominal pain seemed to be very reproducible at this point and I'm not sure of the infiltrates or an incidental finding. Clinical Course     Assessment:  Right-sided chest wall and abdominal pain Renal failure Possible pneumonia   Final Clinical Impression:   Final diagnoses:  Right sided abdominal pain  Community acquired pneumonia     Plan:  Inpatient            Daymon Larsen, MD 06/12/16 2301

## 2016-06-12 NOTE — ED Notes (Signed)
Pt reports that his pain is still a 10, pt appears comfortable and was sleeping prior to this nurse coming into room.  Explained blood draw and hanging of antibiotics.  Pt voiced understanding.  No questions or concerns at this time.

## 2016-06-12 NOTE — ED Triage Notes (Signed)
Pt complains of right sided chest pain starting last night, pt also reports right lower abdominal pain

## 2016-06-12 NOTE — H&P (Signed)
[place] @ [facility] [address] Admission History and Physical [employer], D.O.  ---------------------------------------------------------------------------------------------------------------------   PATIENT NAME: Caleb Hughes MR#: [id number] DATE OF BIRTH: 04/11/1961 DATE OF ADMISSION: 06/12/2016 PRIMARY CARE PHYSICIAN: [facility]  REQUESTING/REFERRING PHYSICIAN: ED Dr. Marcelene Butte  CHIEF COMPLAINT: Chief Complaint  Patient presents with  . Chest Pain    HISTORY OF PRESENT ILLNESS: Caleb Hughes is a 55 y.o. male with a known history of CKD, Diabetes, hyperlipidemia, hypertension, morbid obesity was in a usual state of health until this afternoon when he began experiencing right-sided chest and abdominal pain. Pain is associated with movement. He states that it starts in his right chest described as sharp and gripping and radiates down into the right abdomen. She denies any associated nausea, vomiting, diarrhea or constipation. He denies shortness of breath, palpitations, dizziness or lightheadedness. Denies any recent physical activity or exertion that might have precipitated a muscular strain. He denies any recent travel and recent sick contacts.  Of note he was hospitalized here in May 2017 for acute renal failure. He did follow up with his nurse practitioner but never saw a nephrologist as recommended. He states that he is compliant with medications.  Otherwise there has been no change in status. Patient has been taking medication as prescribed and there has been no recent change in medication or diet.  There has been no recent illness, travel or sick contacts.    Patient denies fevers/chills, weakness, dizziness, shortness of breath, N/V/C/D, dysuria/frequency, changes in mental status.   EMS/ED COURSE:   Patient received Zosyn and tramadol  PAST MEDICAL HISTORY: Past Medical History:  Diagnosis Date  . Diabetes mellitus without complication ([place])   .  Gout   . Hyperlipemia   . Hypertension   . Morbid obesity ([place])       PAST SURGICAL HISTORY: History reviewed. No pertinent surgical history.    SOCIAL HISTORY: Social History  Substance Use Topics  . Smoking status: Never Smoker  . Smokeless tobacco: Never Used  . Alcohol use No      FAMILY HISTORY: Family History  Problem Relation Age of Onset  . Hypertension Mother   . Diabetes type II Mother   . CAD Father   . Hypertension Father   . Alcohol abuse Father   . Alcohol abuse Brother   . Alcohol abuse Brother   . Kidney failure Brother   . Kidney failure Brother      MEDICATIONS AT HOME: Prior to Admission medications   Medication Sig Start Date End Date Taking? Authorizing Provider  amLODipine (NORVASC) 10 MG tablet Take 1 tablet by mouth daily. 02/29/16  Yes Historical Provider, MD  chlorthalidone (HYGROTON) 25 MG tablet Take 25 mg by mouth 2 (two) times daily.   Yes Historical Provider, MD  cloNIDine (CATAPRES) 0.2 MG tablet Take 1 tablet by mouth 3 (three) times daily. 02/29/16  Yes Historical Provider, MD  COLCRYS 0.6 MG tablet Take 1 tablet (0.6 mg total) by mouth every 3 (three) days. 03/16/16  Yes Loletha Grayer, MD  furosemide (LASIX) 20 MG tablet Take 20 mg by mouth 2 (two) times daily.   Yes Historical Provider, MD  lisinopril (PRINIVIL,ZESTRIL) 10 MG tablet Take 10 mg by mouth daily.   Yes Historical Provider, MD  NOVOLOG MIX 70/30 (70-30) 100 UNIT/ML injection Inject 0.08 mLs (8 Units total) into the skin 2 (two) times daily with a meal. Patient taking differently: Inject 70 Units into the skin 3 (three) times daily with meals.  03/16/16  Yes Richard Leslye Peer, MD  simvastatin (ZOCOR) 20 MG tablet Take 1 tablet by mouth daily. 02/29/16  Yes Historical Provider, MD  allopurinol (ZYLOPRIM) 100 MG tablet Take 0.5 tablets (50 mg total) by mouth daily. Patient not taking: Reported on 06/12/2016 03/23/16   Loletha Grayer, MD  predniSONE (DELTASONE) 5 MG tablet 3 tabs po  day1; 2 tabs po day2; 1 tabs po day3; 1/2  tab po day4, 5 Patient not taking: Reported on 06/12/2016 03/16/16   Loletha Grayer, MD  sodium bicarbonate 650 MG tablet Take 2 tablets (1,300 mg total) by mouth 2 (two) times daily. Patient not taking: Reported on 06/12/2016 03/16/16   Loletha Grayer, MD      DRUG ALLERGIES: No Known Allergies   REVIEW OF SYSTEMS: CONSTITUTIONAL: No fever/chills, fatigue, weakness, weight gain/loss, headache EYES: No blurry or double vision. ENT: No tinnitus, postnasal drip, redness or soreness of the oropharynx. RESPIRATORY: No cough, wheeze, hemoptysis, dyspnea. CARDIOVASCULAR: Positive chest pain, negative orthopnea, palpitations, syncope. GASTROINTESTINAL: No nausea, vomiting, constipation, diarrhea, positive abdominal pain, negative hematemesis, melena or hematochezia. GENITOURINARY: No dysuria or hematuria. ENDOCRINE: No polyuria or nocturia. No heat or cold intolerance. HEMATOLOGY: No anemia, bruising, bleeding. INTEGUMENTARY: No rashes, ulcers, lesions. MUSCULOSKELETAL: No arthritis, swelling, gout. NEUROLOGIC: No numbness, tingling, weakness or ataxia. No seizure-type activity. PSYCHIATRIC: No anxiety, depression, insomnia.  PHYSICAL EXAMINATION: VITAL SIGNS: Blood pressure 111/77, pulse 93, temperature 98.7 F (37.1 C), temperature source Oral, resp. rate 20, height 6\' 2"  (1.88 m), weight 122.5 kg (270 lb), SpO2 96 %.  GENERAL: 55 y.o.-year-old black male black male patient, well-developed, well-nourished lying in the bed in no acute distress.  Pleasant and cooperative.   HEENT: Head atraumatic, normocephalic. Pupils equal, round, reactive to light and accommodation. No scleral icterus. Extraocular muscles intact. Nares are patent. Oropharynx is clear. Mucus membranes moist. NECK: Supple, full range of motion. No JVD, no bruit heard. No thyroid enlargement, no tenderness, no cervical lymphadenopathy. CHEST: Normal breath sounds bilaterally. No wheezing,  rales, rhonchi or crackles. No use of accessory muscles of respiration.  Positive reproducible chest wall tenderness at the right lateral rib cage. Patient also winces in pain when he moves his his upper body. He does not have pain with deep inspiration. CARDIOVASCULAR: S1, S2 normal. No murmurs, rubs, or gallops. Cap refill <2 seconds. ABDOMEN: Soft, nontender, nondistended. No rebound, guarding, rigidity. Normoactive bowel sounds present in all four quadrants. No organomegaly or mass. EXTREMITIES: Full range of motion. No pedal edema, cyanosis, or clubbing. NEUROLOGIC: Cranial nerves II through XII are grossly intact with no focal sensorimotor deficit. Muscle strength 5/5 in all extremities. Sensation intact. Gait not checked. PSYCHIATRIC: The patient is alert and oriented x 3. Normal affect, mood, thought content. SKIN: Warm, dry, and intact without obvious rash, lesion, or ulcer.  LABORATORY PANEL:  CBC  Recent Labs Lab 06/12/16 2046  WBC 9.4  HGB 13.3  HCT 41.4  PLT 184   ----------------------------------------------------------------------------------------------------------------- Chemistries  Recent Labs Lab 06/12/16 1859 06/12/16 2046  NA 137  --   K 4.0  --   CL 108  --   CO2 19*  --   GLUCOSE 162*  --   BUN 87*  --   CREATININE 4.37*  --   CALCIUM 9.5  --   AST  --  16  ALT  --  12*  ALKPHOS  --  55  BILITOT  --  0.4   ------------------------------------------------------------------------------------------------------------------ Cardiac Enzymes  Recent Labs Lab 06/12/16 1859  TROPONINI <0.03   ------------------------------------------------------------------------------------------------------------------  RADIOLOGY: Dg Chest 2 View  Result Date: 06/12/2016 CLINICAL DATA:  Right-sided chest pain that began last night. EXAM: CHEST  2 VIEW COMPARISON:  Chest radiograph 03/15/2016 FINDINGS: There is shallow lung inflation. Cardiomediastinal contours  are normal. No pneumothorax. Possible small right pleural effusion. Bilateral basilar linear opacities compatible subsegmental atelectasis. No focal airspace consolidation. IMPRESSION: Bibasilar subsegmental atelectasis and possible small right pleural effusion. No focal airspace consolidation. Electronically Signed   By: Ulyses Jarred M.D.   On: 06/12/2016 19:50   Ct Renal Stone Study  Result Date: 06/12/2016 CLINICAL DATA:  RIGHT chest and RIGHT lower quadrant pain since last night. EXAM: CT ABDOMEN AND PELVIS WITHOUT CONTRAST TECHNIQUE: Multidetector CT imaging of the abdomen and pelvis was performed following the standard protocol without IV contrast. COMPARISON:  Renal ultrasound 03/03/2016. FINDINGS: Lower chest: Patchy pulmonary opacities at both lung bases, much greater on the RIGHT. Small RIGHT effusion. Suspected pneumonia. Hepatobiliary: No mass visualized on this un-enhanced exam. Multiple gallstones, both peripherally calcified as well as containing low attenuation material consistent with lipid. No pericholecystic fluid or inflammation of the fat. No biliary ductal dilatation. Pancreas: No mass or inflammatory process identified on this un-enhanced exam. Spleen: Within normal limits in size. Adrenals/Urinary Tract: No evidence of urolithiasis or hydronephrosis. Tiny parenchymal calcifications on the RIGHT not visible on prior ultrasound, likely sequelae of chronic renal disease. No similar calcifications on the LEFT. Stomach/Bowel: No evidence of obstruction, inflammatory process, or abnormal fluid collections. Vascular/Lymphatic: No pathologically enlarged lymph nodes. No evidence of abdominal aortic aneurysm. Reproductive: No mass or other significant abnormality. Other: Increased body habitus. Musculoskeletal:  No suspicious bone lesions identified. IMPRESSION: RIGHT lung base opacities with pleural fluid, suspected pneumonia. Cholelithiasis without features suggestive of acute cholecystitis. No  obstructive uropathy or nephrolithiasis. No bowel inflammatory process. Electronically Signed   By: Staci Righter M.D.   On: 06/12/2016 21:56    EKG:  Sinus tachycardia with no significant ST or T-wave changes  IMPRESSION AND PLAN:  This is a 55 y.o. male with a history of CK D, diabetes, hypertension, hyperlipidemia, morbid obesity now being admitted with: 1. Acute kidney injury on chronic kidney disease-we will admit the patient for IV fluid hydration and nephrology consult to establish care. Will hold lisinopril at this time. May consider urine electrolytes for further workup however based on previous records it appears that his kidney disease is chronic. 2. Right chest pain possibly secondary to pneumonia. Radial graphic images suggest bibasilar pneumonia which may be the source of the patient's pain. He does not have pain with deep inspiration he does not have cough fevers or chills. He did receive 1 dose of Zosyn in the emergency department. He states that he would not be able to produce a sputum sample. Although the images are suggestive of pneumonia I am reluctant to treat at this time secondary to lack of symptoms, no fever, normal white count. We'll control the patient's pain and consider repeat chest imaging in the AM.   3. Diabetes, hypertension, hyperlipidemia-continue home medications exception of enalapril and insulin. We will cover his blood sugar with regular insulin sliding coverage only.   Diet/Nutrition: Heart healthy, carb controlled  Fluids: IV normal saline DVT Px: Lovenox, SCDs and early ambulation Code Status: Full  All the records are reviewed and case discussed with ED provider. Management plans discussed with the patient and/or family who express understanding and agree with plan of care.   TOTAL TIME TAKING CARE OF THIS PATIENT: 60 minutes.  Caleb Hughes D.O. on 06/12/2016 at 11:46 PM Between 7am to 6pm - Pager - [phone] After 6pm go to [email or web address] -  [employer] [place] [facility] Office [phone] CC: Primary care physician; [facility]     Note: This dictation was prepared with Dragon dictation along with smaller phrase technology. Any transcriptional errors that result from this process are unintentional.

## 2016-06-13 ENCOUNTER — Encounter: Payer: Self-pay | Admitting: *Deleted

## 2016-06-13 LAB — CBC
HEMATOCRIT: 35.2 % — AB (ref 40.0–52.0)
Hemoglobin: 11.8 g/dL — ABNORMAL LOW (ref 13.0–18.0)
MCH: 25.9 pg — AB (ref 26.0–34.0)
MCHC: 33.4 g/dL (ref 32.0–36.0)
MCV: 77.4 fL — AB (ref 80.0–100.0)
Platelets: 153 10*3/uL (ref 150–440)
RBC: 4.55 MIL/uL (ref 4.40–5.90)
RDW: 15.5 % — AB (ref 11.5–14.5)
WBC: 6.9 10*3/uL (ref 3.8–10.6)

## 2016-06-13 LAB — BASIC METABOLIC PANEL
Anion gap: 7 (ref 5–15)
BUN: 85 mg/dL — AB (ref 6–20)
CHLORIDE: 112 mmol/L — AB (ref 101–111)
CO2: 21 mmol/L — AB (ref 22–32)
Calcium: 8.9 mg/dL (ref 8.9–10.3)
Creatinine, Ser: 4.56 mg/dL — ABNORMAL HIGH (ref 0.61–1.24)
GFR calc Af Amer: 15 mL/min — ABNORMAL LOW (ref >60)
GFR calc non Af Amer: 13 mL/min — ABNORMAL LOW (ref >60)
GLUCOSE: 120 mg/dL — AB (ref 65–99)
POTASSIUM: 4.1 mmol/L (ref 3.5–5.1)
SODIUM: 140 mmol/L (ref 135–145)

## 2016-06-13 LAB — GLUCOSE, CAPILLARY
GLUCOSE-CAPILLARY: 96 mg/dL (ref 65–99)
GLUCOSE-CAPILLARY: 110 mg/dL — AB (ref 65–99)
Glucose-Capillary: 137 mg/dL — ABNORMAL HIGH (ref 65–99)
Glucose-Capillary: 118 mg/dL — ABNORMAL HIGH (ref 65–99)
Glucose-Capillary: 119 mg/dL — ABNORMAL HIGH (ref 65–99)

## 2016-06-13 MED ORDER — TRAMADOL HCL 50 MG PO TABS
50.0000 mg | ORAL_TABLET | Freq: Four times a day (QID) | ORAL | Status: DC | PRN
  Administered 2016-06-13 – 2016-06-14 (×3): 50 mg via ORAL
  Filled 2016-06-13 (×3): qty 1

## 2016-06-13 MED ORDER — CLONIDINE HCL 0.1 MG PO TABS
0.1000 mg | ORAL_TABLET | Freq: Two times a day (BID) | ORAL | Status: DC
  Administered 2016-06-13 – 2016-06-14 (×3): 0.1 mg via ORAL
  Filled 2016-06-13 (×4): qty 1

## 2016-06-13 MED ORDER — HEPARIN SODIUM (PORCINE) 5000 UNIT/ML IJ SOLN
5000.0000 [IU] | Freq: Three times a day (TID) | INTRAMUSCULAR | Status: DC
  Administered 2016-06-13 – 2016-06-15 (×5): 5000 [IU] via SUBCUTANEOUS
  Filled 2016-06-13 (×5): qty 1

## 2016-06-13 NOTE — Progress Notes (Signed)
Patient stated he could not find his phone charger.  I called the ED and spoke to the lady in patient relations down there and she said she would look for it and call us if she found it.

## 2016-06-13 NOTE — Progress Notes (Signed)
[facility] [place] NAME: Caleb Hughes    MR#:  [id number]  DATE OF BIRTH:  04/11/1961  SUBJECTIVE:   Patient here due to right-sided chest pain and also noted to be in acute on chronic renal failure. Still having some right-sided chest pain. Renal function about the same since yesterday. No shortness of breath, nausea vomiting  REVIEW OF SYSTEMS:    Review of Systems  Constitutional: Negative for chills and fever.  HENT: Negative for congestion and tinnitus.   Eyes: Negative for blurred vision and double vision.  Respiratory: Negative for cough, shortness of breath and wheezing.   Cardiovascular: Positive for chest pain (Right sided). Negative for orthopnea and PND.  Gastrointestinal: Negative for abdominal pain, diarrhea, nausea and vomiting.  Genitourinary: Negative for dysuria and hematuria.  Neurological: Negative for dizziness, sensory change and focal weakness.  All other systems reviewed and are negative.   Nutrition: Carb control Tolerating Diet: Yes Tolerating PT: Await Eval.      DRUG ALLERGIES:  No Known Allergies  VITALS:  Blood pressure 125/73, pulse 86, temperature 98.2 F (36.8 C), temperature source Oral, resp. rate 17, height 6\' 2"  (1.88 m), weight 122.5 kg (270 lb), SpO2 96 %.  PHYSICAL EXAMINATION:   Physical Exam  GENERAL:  55 y.o.-year-old obese male patient lying in the bed in no acute distress.  EYES: Pupils equal, round, reactive to light and accommodation. No scleral icterus. Extraocular muscles intact.  HEENT: Head atraumatic, normocephalic. Oropharynx and nasopharynx clear.  NECK:  Supple, no jugular venous distention. No thyroid enlargement, no tenderness.  LUNGS: Normal breath sounds bilaterally, no wheezing, rales, rhonchi. No use of accessory muscles of respiration.  CARDIOVASCULAR: S1, S2 normal. No murmurs, rubs, or gallops.  ABDOMEN: Soft, nontender, nondistended. Bowel sounds present. No  organomegaly or mass.  EXTREMITIES: No cyanosis, clubbing or edema b/l.    NEUROLOGIC: Cranial nerves II through XII are intact. No focal Motor or sensory deficits b/l.   PSYCHIATRIC: The patient is alert and oriented x 3.  SKIN: No obvious rash, lesion, or ulcer.    LABORATORY PANEL:   CBC  Recent Labs Lab 06/13/16 0503  WBC 6.9  HGB 11.8*  HCT 35.2*  PLT 153   ------------------------------------------------------------------------------------------------------------------  Chemistries   Recent Labs Lab 06/12/16 2046 06/13/16 0503  NA  --  140  K  --  4.1  CL  --  112*  CO2  --  21*  GLUCOSE  --  120*  BUN  --  85*  CREATININE  --  4.56*  CALCIUM  --  8.9  AST 16  --   ALT 12*  --   ALKPHOS 55  --   BILITOT 0.4  --    ------------------------------------------------------------------------------------------------------------------  Cardiac Enzymes  Recent Labs Lab 06/12/16 1859  TROPONINI <0.03   ------------------------------------------------------------------------------------------------------------------  RADIOLOGY:  Dg Chest 2 View  Result Date: 06/12/2016 CLINICAL DATA:  Right-sided chest pain that began last night. EXAM: CHEST  2 VIEW COMPARISON:  Chest radiograph 03/15/2016 FINDINGS: There is shallow lung inflation. Cardiomediastinal contours are normal. No pneumothorax. Possible small right pleural effusion. Bilateral basilar linear opacities compatible subsegmental atelectasis. No focal airspace consolidation. IMPRESSION: Bibasilar subsegmental atelectasis and possible small right pleural effusion. No focal airspace consolidation. Electronically Signed   By: Ulyses Jarred M.D.   On: 06/12/2016 19:50   Ct Renal Stone Study  Result Date: 06/12/2016 CLINICAL DATA:  RIGHT chest and RIGHT lower quadrant pain since last  night. EXAM: CT ABDOMEN AND PELVIS WITHOUT CONTRAST TECHNIQUE: Multidetector CT imaging of the abdomen and pelvis was performed  following the standard protocol without IV contrast. COMPARISON:  Renal ultrasound 03/03/2016. FINDINGS: Lower chest: Patchy pulmonary opacities at both lung bases, much greater on the RIGHT. Small RIGHT effusion. Suspected pneumonia. Hepatobiliary: No mass visualized on this un-enhanced exam. Multiple gallstones, both peripherally calcified as well as containing low attenuation material consistent with lipid. No pericholecystic fluid or inflammation of the fat. No biliary ductal dilatation. Pancreas: No mass or inflammatory process identified on this un-enhanced exam. Spleen: Within normal limits in size. Adrenals/Urinary Tract: No evidence of urolithiasis or hydronephrosis. Tiny parenchymal calcifications on the RIGHT not visible on prior ultrasound, likely sequelae of chronic renal disease. No similar calcifications on the LEFT. Stomach/Bowel: No evidence of obstruction, inflammatory process, or abnormal fluid collections. Vascular/Lymphatic: No pathologically enlarged lymph nodes. No evidence of abdominal aortic aneurysm. Reproductive: No mass or other significant abnormality. Other: Increased body habitus. Musculoskeletal:  No suspicious bone lesions identified. IMPRESSION: RIGHT lung base opacities with pleural fluid, suspected pneumonia. Cholelithiasis without features suggestive of acute cholecystitis. No obstructive uropathy or nephrolithiasis. No bowel inflammatory process. Electronically Signed   By: Staci Righter M.D.   On: 06/12/2016 21:56     ASSESSMENT AND PLAN:   55 year old male with past medical history of chronic kidney disease stage III, diabetes, obesity, hypertension, history of gout, hyperlipidemia presented to the hospital due to right-sided chest pain and also noted to be in acute on chronic renal failure.  1. Chest pain-etiology unclear but suspected to be secondary to pneumonia. Patient CT renal study did show a suspected right-sided pneumonia. -Clinically patient has no symptoms  presently. Continue supportive care with pain control as I think this is likely musculoskeletal in nature.  2. Acute on chronic renal failure-baseline creatinine is close to 3.5 and patient presented to the hospital with a creatinine of 4.5. -Continue gentle IV fluids, follow BUN and creatinine. Discontinue Lasix, chlorthalidone, lisinopril for now. -Nephrology has been consulted and continue care as per them.  3. Diabetes type 2 with renal complications-continue sliding scale insulin.  4. Gout-no acute attack-continue colchicine.  5. Hyperlipidemia-continue Zocor.  6. Essential hypertension-continue clonidine   All the records are reviewed and case discussed with Care Management/Social Workerr. Management plans discussed with the patient, family and they are in agreement.  CODE STATUS: Full  DVT Prophylaxis: Heparin subcutaneous  TOTAL TIME TAKING CARE OF THIS PATIENT: 30 minutes.   POSSIBLE D/C IN 1-2 DAYS, DEPENDING ON CLINICAL CONDITION.   Henreitta Leber M.D on 06/13/2016 at 3:49 PM  Between 7am to 6pm - Pager - [phone]  After 6pm go to [email or web address] - password [email or web address] [facility]  Office  [phone]  CC: Primary care physician; [facility]

## 2016-06-13 NOTE — Progress Notes (Signed)
[facility]  ROUNDING NOTE   Subjective:  Patient well known to [place] from an admission in May of this year. He presents now with right-sided chest pain. There were right lung base opacities with pleural fluid suggesting pneumonia. No obstructive uropathy noted. Patient has missed 2 outpatient appointments in our office. He stated that he was having issues with transportation.    Objective:  Vital signs in last 24 hours:  Temp:  [97.9 F (36.6 C)-98.7 F (37.1 C)] 98.1 F (36.7 C) (08/15 0539) Pulse Rate:  [78-95] 78 (08/15 0539) Resp:  [18-20] 18 (08/15 0539) BP: (98-112)/(54-93) 102/63 (08/15 0951) SpO2:  [96 %-99 %] 96 % (08/15 0539) Weight:  [122.5 kg (270 lb)] 122.5 kg (270 lb) (08/14 1856)  Weight change:  Filed Weights   06/12/16 1856  Weight: 122.5 kg (270 lb)    Intake/Output: I/O last 3 completed shifts: In: 427 [I.V.:427] Out: -    Intake/Output this shift:  Total I/O In: 240 [P.O.:240] Out: 500 [Urine:500]  Physical Exam: General: No acute distress  Head: Normocephalic, atraumatic. Moist oral mucosal membranes  Eyes: Anicteric  Neck: Supple, trachea midline  Lungs:  Clear to auscultation, normal effort  Heart: S1S2 no rubs  Abdomen:  Soft, nontender, bowel sounds present  Extremities: trace peripheral edema.  Neurologic: Nonfocal, moving all four extremities  Skin: No lesions       Basic Metabolic Panel:  Recent Labs Lab 06/12/16 1859 06/13/16 0503  NA 137 140  K 4.0 4.1  CL 108 112*  CO2 19* 21*  GLUCOSE 162* 120*  BUN 87* 85*  CREATININE 4.37* 4.56*  CALCIUM 9.5 8.9    Liver Function Tests:  Recent Labs Lab 06/12/16 2046  AST 16  ALT 12*  ALKPHOS 55  BILITOT 0.4  PROT 8.4*  ALBUMIN 4.4    Recent Labs Lab 06/12/16 2046  LIPASE 27   No results for input(s): AMMONIA in the last 168 hours.  CBC:  Recent Labs Lab 06/12/16 2046 06/13/16 0503  WBC 9.4 6.9  NEUTROABS 6.1  --   HGB 13.3 11.8*  HCT 41.4  35.2*  MCV 77.8* 77.4*  PLT 184 153    Cardiac Enzymes:  Recent Labs Lab 06/12/16 1859 06/12/16 2046  CKTOTAL  --  792*  TROPONINI <0.03  --     BNP: Invalid input(s): POCBNP  CBG:  Recent Labs Lab 06/13/16 0135 06/13/16 0731 06/13/16 1122  GLUCAP 137* 96 118*    Microbiology: Results for orders placed or performed during the hospital encounter of 06/12/16  Culture, blood (Routine X 2) w Reflex to ID Panel     Status: None (Preliminary result)   Collection Time: 06/12/16 10:15 PM  Result Value Ref Range Status   Specimen Description BLOOD LEFT FATTY CASTS  Final   Special Requests   Final    BOTTLES DRAWN AEROBIC AND ANAEROBIC 6CC AERO 5CC ANA   Culture NO GROWTH < 12 HOURS  Final   Report Status PENDING  Incomplete  Culture, blood (Routine X 2) w Reflex to ID Panel     Status: None (Preliminary result)   Collection Time: 06/12/16 10:15 PM  Result Value Ref Range Status   Specimen Description BLOOD LEFT ASSIST CONTROL  Final   Special Requests BOTTLES DRAWN AEROBIC AND ANAEROBIC 6CC  Final   Culture NO GROWTH < 12 HOURS  Final   Report Status PENDING  Incomplete    Coagulation Studies: No results for input(s): LABPROT, INR in the  last 72 hours.  Urinalysis: No results for input(s): COLORURINE, LABSPEC, PHURINE, GLUCOSEU, HGBUR, BILIRUBINUR, KETONESUR, PROTEINUR, UROBILINOGEN, NITRITE, LEUKOCYTESUR in the last 72 hours.  Invalid input(s): APPERANCEUR    Imaging: Dg Chest 2 View  Result Date: 06/12/2016 CLINICAL DATA:  Right-sided chest pain that began last night. EXAM: CHEST  2 VIEW COMPARISON:  Chest radiograph 03/15/2016 FINDINGS: There is shallow lung inflation. Cardiomediastinal contours are normal. No pneumothorax. Possible small right pleural effusion. Bilateral basilar linear opacities compatible subsegmental atelectasis. No focal airspace consolidation. IMPRESSION: Bibasilar subsegmental atelectasis and possible small right pleural effusion. No focal  airspace consolidation. Electronically Signed   By: Ulyses Jarred M.D.   On: 06/12/2016 19:50   Ct Renal Stone Study  Result Date: 06/12/2016 CLINICAL DATA:  RIGHT chest and RIGHT lower quadrant pain since last night. EXAM: CT ABDOMEN AND PELVIS WITHOUT CONTRAST TECHNIQUE: Multidetector CT imaging of the abdomen and pelvis was performed following the standard protocol without IV contrast. COMPARISON:  Renal ultrasound 03/03/2016. FINDINGS: Lower chest: Patchy pulmonary opacities at both lung bases, much greater on the RIGHT. Small RIGHT effusion. Suspected pneumonia. Hepatobiliary: No mass visualized on this un-enhanced exam. Multiple gallstones, both peripherally calcified as well as containing low attenuation material consistent with lipid. No pericholecystic fluid or inflammation of the fat. No biliary ductal dilatation. Pancreas: No mass or inflammatory process identified on this un-enhanced exam. Spleen: Within normal limits in size. Adrenals/Urinary Tract: No evidence of urolithiasis or hydronephrosis. Tiny parenchymal calcifications on the RIGHT not visible on prior ultrasound, likely sequelae of chronic renal disease. No similar calcifications on the LEFT. Stomach/Bowel: No evidence of obstruction, inflammatory process, or abnormal fluid collections. Vascular/Lymphatic: No pathologically enlarged lymph nodes. No evidence of abdominal aortic aneurysm. Reproductive: No mass or other significant abnormality. Other: Increased body habitus. Musculoskeletal:  No suspicious bone lesions identified. IMPRESSION: RIGHT lung base opacities with pleural fluid, suspected pneumonia. Cholelithiasis without features suggestive of acute cholecystitis. No obstructive uropathy or nephrolithiasis. No bowel inflammatory process. Electronically Signed   By: Staci Righter M.D.   On: 06/12/2016 21:56     Medications:   . sodium chloride 100 mL/hr at 06/13/16 0023   . cloNIDine  0.1 mg Oral BID  . colchicine  0.6 mg  Oral Q72H  . enoxaparin (LOVENOX) injection  30 mg Subcutaneous Q24H  . insulin aspart  0-12 Units Subcutaneous TID AC & HS  . simvastatin  20 mg Oral Daily   acetaminophen **OR** acetaminophen, HYDROcodone-acetaminophen, ipratropium-albuterol, ondansetron **OR** ondansetron (ZOFRAN) IV, traMADol  Assessment/ Plan:  55 y.o. male with a PMHX of long-standing diabetes type 2 with neuropathy, hypertension, hyperlipidemia, obesity, schizophrenia, gout, CKD stage IV baseline Cr 3.6/egfr 20.  1. Acute renal failure/chronic kidney disease stage IV baseline creatinine 3.6 EGFR 20. We saw patient earlier in the year in May 2017 for an episode of acute renal failure. His creatinine came down to 3.6. His serologic workup was largely negative at that point in time with the exception of abnormal SPEP and UPEP. He did not come for his follow-up for his underlying chronic kidney disease due to transportation issues. Continue IV fluid hydration with 0.9 normal saline for now. Advised the patient of the importance of follow-up for his underlying chronic kidney disease which appears to be advanced.  2. Anemia of chronic kidney disease. Hemoglobin currently 11.8. No urgent indication for Procrit at the moment.  3. Hypotension. Blood pressure currently 102/63. Continue IV fluid hydration.  Most of his regular antihypertensives are currently on  hold with the exception of clonidine.  4. Thanks for consultation.   LOS: 1 Anjuli Gemmill 8/15/201711:40 AM

## 2016-06-14 LAB — BLOOD CULTURE ID PANEL (REFLEXED)
ACINETOBACTER BAUMANNII: NOT DETECTED
CANDIDA KRUSEI: NOT DETECTED
CARBAPENEM RESISTANCE: NOT DETECTED
Candida albicans: NOT DETECTED
Candida glabrata: NOT DETECTED
Candida parapsilosis: NOT DETECTED
Candida tropicalis: NOT DETECTED
ENTEROBACTERIACEAE SPECIES: NOT DETECTED
Enterobacter cloacae complex: NOT DETECTED
Enterococcus species: NOT DETECTED
Escherichia coli: NOT DETECTED
HAEMOPHILUS INFLUENZAE: NOT DETECTED
KLEBSIELLA OXYTOCA: NOT DETECTED
Klebsiella pneumoniae: NOT DETECTED
Listeria monocytogenes: NOT DETECTED
METHICILLIN RESISTANCE: NOT DETECTED
NEISSERIA MENINGITIDIS: NOT DETECTED
PSEUDOMONAS AERUGINOSA: NOT DETECTED
Proteus species: NOT DETECTED
STAPHYLOCOCCUS AUREUS BCID: NOT DETECTED
STAPHYLOCOCCUS SPECIES: DETECTED — AB
STREPTOCOCCUS AGALACTIAE: NOT DETECTED
STREPTOCOCCUS SPECIES: NOT DETECTED
Serratia marcescens: NOT DETECTED
Streptococcus pneumoniae: NOT DETECTED
Streptococcus pyogenes: NOT DETECTED
VANCOMYCIN RESISTANCE: NOT DETECTED

## 2016-06-14 LAB — BASIC METABOLIC PANEL
Anion gap: 10 (ref 5–15)
BUN: 76 mg/dL — AB (ref 6–20)
CALCIUM: 9 mg/dL (ref 8.9–10.3)
CO2: 18 mmol/L — ABNORMAL LOW (ref 22–32)
CREATININE: 4.29 mg/dL — AB (ref 0.61–1.24)
Chloride: 111 mmol/L (ref 101–111)
GFR, EST AFRICAN AMERICAN: 16 mL/min — AB (ref >60)
GFR, EST NON AFRICAN AMERICAN: 14 mL/min — AB (ref >60)
Glucose, Bld: 106 mg/dL — ABNORMAL HIGH (ref 65–99)
Potassium: 4.2 mmol/L (ref 3.5–5.1)
SODIUM: 139 mmol/L (ref 135–145)

## 2016-06-14 LAB — GLUCOSE, CAPILLARY
GLUCOSE-CAPILLARY: 147 mg/dL — AB (ref 65–99)
GLUCOSE-CAPILLARY: 101 mg/dL — AB (ref 65–99)
GLUCOSE-CAPILLARY: 127 mg/dL — AB (ref 65–99)
Glucose-Capillary: 98 mg/dL (ref 65–99)

## 2016-06-14 NOTE — Progress Notes (Signed)
[place] [facility] [place] NAME: Grantley Stan    MR#:  [id number]  DATE OF BIRTH:  04/11/1961  SUBJECTIVE:   Patient here due to right-sided chest pain and also noted to be in acute on chronic renal failure. Chest pain improved. Renal function a bit improved.   REVIEW OF SYSTEMS:    Review of Systems  Constitutional: Negative for chills and fever.  HENT: Negative for congestion and tinnitus.   Eyes: Negative for blurred vision and double vision.  Respiratory: Negative for cough, shortness of breath and wheezing.   Cardiovascular: Positive for chest pain (Right sided). Negative for orthopnea and PND.  Gastrointestinal: Negative for abdominal pain, diarrhea, nausea and vomiting.  Genitourinary: Negative for dysuria and hematuria.  Neurological: Negative for dizziness, sensory change and focal weakness.  All other systems reviewed and are negative.   Nutrition: Carb control Tolerating Diet: Yes Tolerating PT: Await Eval.    DRUG ALLERGIES:  No Known Allergies  VITALS:  Blood pressure 118/72, pulse 90, temperature 98.9 F (37.2 C), temperature source Oral, resp. rate 18, height 6\' 2"  (1.88 m), weight 122.5 kg (270 lb), SpO2 94 %.  PHYSICAL EXAMINATION:   Physical Exam  GENERAL:  55 y.o.-year-old obese male patient lying in the bed in no acute distress.  EYES: Pupils equal, round, reactive to light and accommodation. No scleral icterus. Extraocular muscles intact.  HEENT: Head atraumatic, normocephalic. Oropharynx and nasopharynx clear.  NECK:  Supple, no jugular venous distention. No thyroid enlargement, no tenderness.  LUNGS: Normal breath sounds bilaterally, no wheezing, rales, rhonchi. No use of accessory muscles of respiration.  CARDIOVASCULAR: S1, S2 normal. No murmurs, rubs, or gallops.  ABDOMEN: Soft, nontender, nondistended. Bowel sounds present. No organomegaly or mass.  EXTREMITIES: No cyanosis, clubbing or edema b/l.     NEUROLOGIC: Cranial nerves II through XII are intact. No focal Motor or sensory deficits b/l.   PSYCHIATRIC: The patient is alert and oriented x 3.  SKIN: No obvious rash, lesion, or ulcer.    LABORATORY PANEL:   CBC  Recent Labs Lab 06/13/16 0503  WBC 6.9  HGB 11.8*  HCT 35.2*  PLT 153   ------------------------------------------------------------------------------------------------------------------  Chemistries   Recent Labs Lab 06/12/16 2046  06/14/16 0420  NA  --   < > 139  K  --   < > 4.2  CL  --   < > 111  CO2  --   < > 18*  GLUCOSE  --   < > 106*  BUN  --   < > 76*  CREATININE  --   < > 4.29*  CALCIUM  --   < > 9.0  AST 16  --   --   ALT 12*  --   --   ALKPHOS 55  --   --   BILITOT 0.4  --   --   < > = values in this interval not displayed. ------------------------------------------------------------------------------------------------------------------  Cardiac Enzymes  Recent Labs Lab 06/12/16 1859  TROPONINI <0.03   ------------------------------------------------------------------------------------------------------------------  RADIOLOGY:  Dg Chest 2 View  Result Date: 06/12/2016 CLINICAL DATA:  Right-sided chest pain that began last night. EXAM: CHEST  2 VIEW COMPARISON:  Chest radiograph 03/15/2016 FINDINGS: There is shallow lung inflation. Cardiomediastinal contours are normal. No pneumothorax. Possible small right pleural effusion. Bilateral basilar linear opacities compatible subsegmental atelectasis. No focal airspace consolidation. IMPRESSION: Bibasilar subsegmental atelectasis and possible small right pleural effusion. No focal airspace consolidation. Electronically Signed  By: Ulyses Jarred M.D.   On: 06/12/2016 19:50   Ct Renal Stone Study  Result Date: 06/12/2016 CLINICAL DATA:  RIGHT chest and RIGHT lower quadrant pain since last night. EXAM: CT ABDOMEN AND PELVIS WITHOUT CONTRAST TECHNIQUE: Multidetector CT imaging of the abdomen  and pelvis was performed following the standard protocol without IV contrast. COMPARISON:  Renal ultrasound 03/03/2016. FINDINGS: Lower chest: Patchy pulmonary opacities at both lung bases, much greater on the RIGHT. Small RIGHT effusion. Suspected pneumonia. Hepatobiliary: No mass visualized on this un-enhanced exam. Multiple gallstones, both peripherally calcified as well as containing low attenuation material consistent with lipid. No pericholecystic fluid or inflammation of the fat. No biliary ductal dilatation. Pancreas: No mass or inflammatory process identified on this un-enhanced exam. Spleen: Within normal limits in size. Adrenals/Urinary Tract: No evidence of urolithiasis or hydronephrosis. Tiny parenchymal calcifications on the RIGHT not visible on prior ultrasound, likely sequelae of chronic renal disease. No similar calcifications on the LEFT. Stomach/Bowel: No evidence of obstruction, inflammatory process, or abnormal fluid collections. Vascular/Lymphatic: No pathologically enlarged lymph nodes. No evidence of abdominal aortic aneurysm. Reproductive: No mass or other significant abnormality. Other: Increased body habitus. Musculoskeletal:  No suspicious bone lesions identified. IMPRESSION: RIGHT lung base opacities with pleural fluid, suspected pneumonia. Cholelithiasis without features suggestive of acute cholecystitis. No obstructive uropathy or nephrolithiasis. No bowel inflammatory process. Electronically Signed   By: Staci Righter M.D.   On: 06/12/2016 21:56     ASSESSMENT AND PLAN:   55 year old male with past medical history of chronic kidney disease stage III, diabetes, obesity, hypertension, history of gout, hyperlipidemia presented to the hospital due to right-sided chest pain and also noted to be in acute on chronic renal failure.  1. Chest pain-Pneumonia/musculoskeletal in nature. Patient CT renal study did show a suspected right-sided pneumonia. -Clinically patient has no symptoms  presently. Continue supportive care with pain control as I think this is likely musculoskeletal in nature.  2. Acute on chronic renal failure-baseline creatinine is close to 3.5 and patient presented to the hospital with a creatinine of 4.5. Cr. Down to 4.3 -Continue gentle IV fluids, follow BUN and creatinine. hold Lasix, chlorthalidone, lisinopril for now. - appreciate Nepro input.   3. Diabetes type 2 with renal complications-continue sliding scale insulin. - BS stable.   4. Gout-no acute attack-continue colchicine.  5. Hyperlipidemia-continue Zocor.  6. Essential hypertension-continue clonidine  Likely d/c home tomorrow if Cr. Trending down.   All the records are reviewed and case discussed with Care Management/Social Workerr. Management plans discussed with the patient, family and they are in agreement.  CODE STATUS: Full  DVT Prophylaxis: Heparin subcutaneous  TOTAL TIME TAKING CARE OF THIS PATIENT: 25 minutes.   POSSIBLE D/C IN 1-2 DAYS, DEPENDING ON CLINICAL CONDITION.   Henreitta Leber M.D on 06/14/2016 at 4:20 PM  Between 7am to 6pm - Pager - [phone]  After 6pm go to [email or web address] - password [email or web address] [facility]  Office  [phone]  CC: Primary care physician; [facility]

## 2016-06-14 NOTE — Progress Notes (Signed)
[facility]  ROUNDING NOTE   Subjective:  Creatinine slightly lower today at 4.29. BUN also down to 76. Overall low renal function however.     Objective:  Vital signs in last 24 hours:  Temp:  [98.1 F (36.7 C)-99 F (37.2 C)] 98.9 F (37.2 C) (08/16 1259) Pulse Rate:  [90-107] 90 (08/16 1259) Resp:  [18-20] 18 (08/16 1259) BP: (114-132)/(63-79) 118/72 (08/16 1319) SpO2:  [92 %-95 %] 94 % (08/16 1259)  Weight change:  Filed Weights   06/12/16 1856  Weight: 122.5 kg (270 lb)    Intake/Output: I/O last 3 completed shifts: In: 1217 [P.O.:360; I.V.:857] Out: 1900 [Urine:1900]   Intake/Output this shift:  Total I/O In: 240 [P.O.:240] Out: 800 [Urine:800]  Physical Exam: General: No acute distress  Head: Normocephalic, atraumatic. Moist oral mucosal membranes  Eyes: Anicteric  Neck: Supple, trachea midline  Lungs:  Clear to auscultation, normal effort  Heart: S1S2 no rubs  Abdomen:  Soft, nontender, bowel sounds present  Extremities: trace peripheral edema.  Neurologic: Nonfocal, moving all four extremities  Skin: No lesions       Basic Metabolic Panel:  Recent Labs Lab 06/12/16 1859 06/13/16 0503 06/14/16 0420  NA 137 140 139  K 4.0 4.1 4.2  CL 108 112* 111  CO2 19* 21* 18*  GLUCOSE 162* 120* 106*  BUN 87* 85* 76*  CREATININE 4.37* 4.56* 4.29*  CALCIUM 9.5 8.9 9.0    Liver Function Tests:  Recent Labs Lab 06/12/16 2046  AST 16  ALT 12*  ALKPHOS 55  BILITOT 0.4  PROT 8.4*  ALBUMIN 4.4    Recent Labs Lab 06/12/16 2046  LIPASE 27   No results for input(s): AMMONIA in the last 168 hours.  CBC:  Recent Labs Lab 06/12/16 2046 06/13/16 0503  WBC 9.4 6.9  NEUTROABS 6.1  --   HGB 13.3 11.8*  HCT 41.4 35.2*  MCV 77.8* 77.4*  PLT 184 153    Cardiac Enzymes:  Recent Labs Lab 06/12/16 1859 06/12/16 2046  CKTOTAL  --  792*  TROPONINI <0.03  --     BNP: Invalid input(s): POCBNP  CBG:  Recent Labs Lab  06/13/16 1122 06/13/16 1629 06/13/16 2105 06/14/16 0737 06/14/16 1148  GLUCAP 118* 110* 119* 21 147*    Microbiology: Results for orders placed or performed during the hospital encounter of 06/12/16  Culture, blood (Routine X 2) w Reflex to ID Panel     Status: None (Preliminary result)   Collection Time: 06/12/16 10:15 PM  Result Value Ref Range Status   Specimen Description BLOOD LEFT FATTY CASTS  Final   Special Requests   Final    BOTTLES DRAWN AEROBIC AND ANAEROBIC 6CC AERO 5CC ANA   Culture  Setup Time   Final    GRAM POSITIVE COCCI AEROBIC BOTTLE ONLY CRITICAL RESULT CALLED TO, READ BACK BY AND VERIFIED WITH: NATE COOKSON AT 7048 06/14/16 SDR Organism ID to follow    Culture GRAM POSITIVE COCCI  Final   Report Status PENDING  Incomplete  Culture, blood (Routine X 2) w Reflex to ID Panel     Status: None (Preliminary result)   Collection Time: 06/12/16 10:15 PM  Result Value Ref Range Status   Specimen Description BLOOD LEFT ASSIST CONTROL  Final   Special Requests BOTTLES DRAWN AEROBIC AND ANAEROBIC 6CC  Final   Culture NO GROWTH 1 DAY  Final   Report Status PENDING  Incomplete  Blood Culture ID Panel (Reflexed)  Status: Abnormal   Collection Time: 06/12/16 10:15 PM  Result Value Ref Range Status   Enterococcus species NOT DETECTED NOT DETECTED Final   Vancomycin resistance NOT DETECTED NOT DETECTED Final   Listeria monocytogenes NOT DETECTED NOT DETECTED Final   Staphylococcus species DETECTED (A) NOT DETECTED Final    Comment: CRITICAL RESULT CALLED TO, READ BACK BY AND VERIFIED WITH: NATE COOKSON AT 0546 06/14/16 SDR    Staphylococcus aureus NOT DETECTED NOT DETECTED Final   Methicillin resistance NOT DETECTED NOT DETECTED Final   Streptococcus species NOT DETECTED NOT DETECTED Final   Streptococcus agalactiae NOT DETECTED NOT DETECTED Final   Streptococcus pneumoniae NOT DETECTED NOT DETECTED Final   Streptococcus pyogenes NOT DETECTED NOT DETECTED Final    Acinetobacter baumannii NOT DETECTED NOT DETECTED Final   Enterobacteriaceae species NOT DETECTED NOT DETECTED Final   Enterobacter cloacae complex NOT DETECTED NOT DETECTED Final   Escherichia coli NOT DETECTED NOT DETECTED Final   Klebsiella oxytoca NOT DETECTED NOT DETECTED Final   Klebsiella pneumoniae NOT DETECTED NOT DETECTED Final   Proteus species NOT DETECTED NOT DETECTED Final   Serratia marcescens NOT DETECTED NOT DETECTED Final   Carbapenem resistance NOT DETECTED NOT DETECTED Final   Haemophilus influenzae NOT DETECTED NOT DETECTED Final   Neisseria meningitidis NOT DETECTED NOT DETECTED Final   Pseudomonas aeruginosa NOT DETECTED NOT DETECTED Final   Candida albicans NOT DETECTED NOT DETECTED Final   Candida glabrata NOT DETECTED NOT DETECTED Final   Candida krusei NOT DETECTED NOT DETECTED Final   Candida parapsilosis NOT DETECTED NOT DETECTED Final   Candida tropicalis NOT DETECTED NOT DETECTED Final    Coagulation Studies: No results for input(s): LABPROT, INR in the last 72 hours.  Urinalysis: No results for input(s): COLORURINE, LABSPEC, PHURINE, GLUCOSEU, HGBUR, BILIRUBINUR, KETONESUR, PROTEINUR, UROBILINOGEN, NITRITE, LEUKOCYTESUR in the last 72 hours.  Invalid input(s): APPERANCEUR    Imaging: Dg Chest 2 View  Result Date: 06/12/2016 CLINICAL DATA:  Right-sided chest pain that began last night. EXAM: CHEST  2 VIEW COMPARISON:  Chest radiograph 03/15/2016 FINDINGS: There is shallow lung inflation. Cardiomediastinal contours are normal. No pneumothorax. Possible small right pleural effusion. Bilateral basilar linear opacities compatible subsegmental atelectasis. No focal airspace consolidation. IMPRESSION: Bibasilar subsegmental atelectasis and possible small right pleural effusion. No focal airspace consolidation. Electronically Signed   By: Ulyses Jarred M.D.   On: 06/12/2016 19:50   Ct Renal Stone Study  Result Date: 06/12/2016 CLINICAL DATA:  RIGHT chest and  RIGHT lower quadrant pain since last night. EXAM: CT ABDOMEN AND PELVIS WITHOUT CONTRAST TECHNIQUE: Multidetector CT imaging of the abdomen and pelvis was performed following the standard protocol without IV contrast. COMPARISON:  Renal ultrasound 03/03/2016. FINDINGS: Lower chest: Patchy pulmonary opacities at both lung bases, much greater on the RIGHT. Small RIGHT effusion. Suspected pneumonia. Hepatobiliary: No mass visualized on this un-enhanced exam. Multiple gallstones, both peripherally calcified as well as containing low attenuation material consistent with lipid. No pericholecystic fluid or inflammation of the fat. No biliary ductal dilatation. Pancreas: No mass or inflammatory process identified on this un-enhanced exam. Spleen: Within normal limits in size. Adrenals/Urinary Tract: No evidence of urolithiasis or hydronephrosis. Tiny parenchymal calcifications on the RIGHT not visible on prior ultrasound, likely sequelae of chronic renal disease. No similar calcifications on the LEFT. Stomach/Bowel: No evidence of obstruction, inflammatory process, or abnormal fluid collections. Vascular/Lymphatic: No pathologically enlarged lymph nodes. No evidence of abdominal aortic aneurysm. Reproductive: No mass or other significant abnormality. Other: Increased  body habitus. Musculoskeletal:  No suspicious bone lesions identified. IMPRESSION: RIGHT lung base opacities with pleural fluid, suspected pneumonia. Cholelithiasis without features suggestive of acute cholecystitis. No obstructive uropathy or nephrolithiasis. No bowel inflammatory process. Electronically Signed   By: Staci Righter M.D.   On: 06/12/2016 21:56     Medications:   . sodium chloride 100 mL/hr at 06/14/16 0824   . cloNIDine  0.1 mg Oral BID  . colchicine  0.6 mg Oral Q72H  . heparin subcutaneous  5,000 Units Subcutaneous Q8H  . insulin aspart  0-12 Units Subcutaneous TID AC & HS  . simvastatin  20 mg Oral Daily   acetaminophen **OR**  acetaminophen, HYDROcodone-acetaminophen, ipratropium-albuterol, ondansetron **OR** ondansetron (ZOFRAN) IV, traMADol  Assessment/ Plan:  55 y.o. male with a PMHX of long-standing diabetes type 2 with neuropathy, hypertension, hyperlipidemia, obesity, schizophrenia, gout, CKD stage IV baseline Cr 3.6/egfr 20.  1. Acute renal failure/chronic kidney disease stage IV baseline creatinine 3.6 EGFR 20. We saw patient earlier in the year in May 2017 for an episode of acute renal failure. His creatinine came down to 3.6. His serologic workup was largely negative at that point in time with the exception of abnormal SPEP and UPEP. He did not come for his follow-up for his underlying chronic kidney disease due to inability to get to our [place] office. -  enal function slightly improved.  Creatinine currently down to 4.2 and BUN is currently 76.  Continue IV fluid hydration for one additional day.  No hydronephrosis noted on CT scan.  Patient states that he would like to come to our [place] office for follow up.   2. Anemia of chronic kidney disease. Hemoglobin currently 11.8.  Hold off on Epogen for now.  3. Hypotension. Blood pressure has improved and is currently 118/72.  Patient is off of many of his antihypertensives for home.  Continue clonidine to avoid rebound hypertension.     LOS: 2 Caleb Hughes 8/16/20172:04 PM

## 2016-06-14 NOTE — Progress Notes (Signed)
Pharmacy Antibiotic Follow-up Note  Caleb Hughes is a 55 y.o. year-old male admitted on 06/12/2016.   Assessment/Plan: Lab reports GPC in aerobic bottle of 1 set staphlococcus species. Afebrile, WBC 6.9, normotensive, HR 107 x 1. Spoke with Dr. Marcille Blanco, will hold off on abx pending further results.  Temp (24hrs), Avg:98.4 F (36.9 C), Min:98.1 F (36.7 C), Max:99 F (37.2 C)   Recent Labs Lab 06/12/16 2046 06/13/16 0503  WBC 9.4 6.9    Recent Labs Lab 06/12/16 1859 06/13/16 0503 06/14/16 0420  CREATININE 4.37* 4.56* 4.29*   Estimated Creatinine Clearance: 26.7 mL/min (by C-G formula based on SCr of 4.29 mg/dL).    No Known Allergies  Thank you for allowing pharmacy to be a part of this patient's care.  Laural Benes, Pharm.D., BCPS Clinical Pharmacist 06/14/2016 6:04 AM

## 2016-06-15 LAB — GLUCOSE, CAPILLARY: Glucose-Capillary: 95 mg/dL (ref 65–99)

## 2016-06-15 LAB — BASIC METABOLIC PANEL
ANION GAP: 8 (ref 5–15)
BUN: 66 mg/dL — ABNORMAL HIGH (ref 6–20)
CALCIUM: 8.9 mg/dL (ref 8.9–10.3)
CHLORIDE: 111 mmol/L (ref 101–111)
CO2: 20 mmol/L — ABNORMAL LOW (ref 22–32)
CREATININE: 3.92 mg/dL — AB (ref 0.61–1.24)
GFR calc non Af Amer: 16 mL/min — ABNORMAL LOW (ref >60)
GFR, EST AFRICAN AMERICAN: 18 mL/min — AB (ref >60)
Glucose, Bld: 94 mg/dL (ref 65–99)
Potassium: 4.2 mmol/L (ref 3.5–5.1)
SODIUM: 139 mmol/L (ref 135–145)

## 2016-06-15 MED ORDER — TRAMADOL HCL 50 MG PO TABS: 50.0000 mg | ORAL_TABLET | Freq: Four times a day (QID) | ORAL | 0 refills | Status: DC | PRN

## 2016-06-15 MED ORDER — CLONIDINE HCL 0.1 MG PO TABS: 0.1000 mg | ORAL_TABLET | Freq: Two times a day (BID) | ORAL | 2 refills | Status: DC

## 2016-06-15 NOTE — Progress Notes (Signed)
Patient discharged to home. Patient is alert and oriented. Ambulates without assistance. Patient took out his saline lock and IV site is clean and dry and skin intact. Patient denies pain at this time. Clonidine held this AM and Zocar given prior to discharge. Family picked up patient and patient taken by volunteer to awaiting car.

## 2016-06-15 NOTE — Progress Notes (Signed)
[facility]  ROUNDING NOTE   Subjective:  Renal function improved today. Creatinine down to 3.9. Overall patient feeling better as compared to admission.   Objective:  Vital signs in last 24 hours:  Temp:  [98.6 F (37 C)-99.4 F (37.4 C)] 99.4 F (37.4 C) (08/17 0515) Pulse Rate:  [90-105] 105 (08/17 0515) Resp:  [15-18] 16 (08/17 0515) BP: (102-118)/(52-90) 114/90 (08/17 1104) SpO2:  [94 %] 94 % (08/17 0515)  Weight change:  Filed Weights   06/12/16 1856  Weight: 122.5 kg (270 lb)    Intake/Output: I/O last 3 completed shifts: In: 2576 [P.O.:1560; I.V.:1016] Out: 2525 [Urine:2525]   Intake/Output this shift:  No intake/output data recorded.  Physical Exam: General: No acute distress  Head: Normocephalic, atraumatic. Moist oral mucosal membranes  Eyes: Anicteric  Neck: Supple, trachea midline  Lungs:  Clear to auscultation, normal effort  Heart: S1S2 no rubs  Abdomen:  Soft, nontender, bowel sounds present  Extremities: trace peripheral edema.  Neurologic: Nonfocal, moving all four extremities  Skin: No lesions       Basic Metabolic Panel:  Recent Labs Lab 06/12/16 1859 06/13/16 0503 06/14/16 0420 06/15/16 0412  NA 137 140 139 139  K 4.0 4.1 4.2 4.2  CL 108 112* 111 111  CO2 19* 21* 18* 20*  GLUCOSE 162* 120* 106* 94  BUN 87* 85* 76* 66*  CREATININE 4.37* 4.56* 4.29* 3.92*  CALCIUM 9.5 8.9 9.0 8.9    Liver Function Tests:  Recent Labs Lab 06/12/16 2046  AST 16  ALT 12*  ALKPHOS 55  BILITOT 0.4  PROT 8.4*  ALBUMIN 4.4    Recent Labs Lab 06/12/16 2046  LIPASE 27   No results for input(s): AMMONIA in the last 168 hours.  CBC:  Recent Labs Lab 06/12/16 2046 06/13/16 0503  WBC 9.4 6.9  NEUTROABS 6.1  --   HGB 13.3 11.8*  HCT 41.4 35.2*  MCV 77.8* 77.4*  PLT 184 153    Cardiac Enzymes:  Recent Labs Lab 06/12/16 1859 06/12/16 2046  CKTOTAL  --  792*  TROPONINI <0.03  --     BNP: Invalid input(s):  POCBNP  CBG:  Recent Labs Lab 06/14/16 0737 06/14/16 1148 06/14/16 1632 06/14/16 2101 06/15/16 0748  GLUCAP 98 147* 101* 127* 95    Microbiology: Results for orders placed or performed during the hospital encounter of 06/12/16  Culture, blood (Routine X 2) w Reflex to ID Panel     Status: None (Preliminary result)   Collection Time: 06/12/16 10:15 PM  Result Value Ref Range Status   Specimen Description   Final    BLOOD BLOOD LEFT FOREARM Performed at [facility]    Special Requests   Final    BOTTLES DRAWN AEROBIC AND ANAEROBIC 6CC AERO 5CC ANA   Culture  Setup Time   Final    GRAM POSITIVE COCCI IN BOTH AEROBIC AND ANAEROBIC BOTTLES CRITICAL RESULT CALLED TO, READ BACK BY AND VERIFIED WITH: [place] AT 6834 06/14/16 SDR Organism ID to follow    Culture GRAM POSITIVE COCCI  Final   Report Status PENDING  Incomplete  Culture, blood (Routine X 2) w Reflex to ID Panel     Status: None (Preliminary result)   Collection Time: 06/12/16 10:15 PM  Result Value Ref Range Status   Specimen Description BLOOD LEFT ASSIST CONTROL  Final   Special Requests BOTTLES DRAWN AEROBIC AND ANAEROBIC 6CC  Final   Culture NO GROWTH 2 DAYS  Final   Report Status PENDING  Incomplete  Blood Culture ID Panel (Reflexed)     Status: Abnormal   Collection Time: 06/12/16 10:15 PM  Result Value Ref Range Status   Enterococcus species NOT DETECTED NOT DETECTED Final   Vancomycin resistance NOT DETECTED NOT DETECTED Final   Listeria monocytogenes NOT DETECTED NOT DETECTED Final   Staphylococcus species DETECTED (A) NOT DETECTED Final    Comment: CRITICAL RESULT CALLED TO, READ BACK BY AND VERIFIED WITH: NATE COOKSON AT 0546 06/14/16 SDR    Staphylococcus aureus NOT DETECTED NOT DETECTED Final   Methicillin resistance NOT DETECTED NOT DETECTED Final   Streptococcus species NOT DETECTED NOT DETECTED Final   Streptococcus agalactiae NOT DETECTED NOT DETECTED Final   Streptococcus  pneumoniae NOT DETECTED NOT DETECTED Final   Streptococcus pyogenes NOT DETECTED NOT DETECTED Final   Acinetobacter baumannii NOT DETECTED NOT DETECTED Final   Enterobacteriaceae species NOT DETECTED NOT DETECTED Final   Enterobacter cloacae complex NOT DETECTED NOT DETECTED Final   Escherichia coli NOT DETECTED NOT DETECTED Final   Klebsiella oxytoca NOT DETECTED NOT DETECTED Final   Klebsiella pneumoniae NOT DETECTED NOT DETECTED Final   Proteus species NOT DETECTED NOT DETECTED Final   Serratia marcescens NOT DETECTED NOT DETECTED Final   Carbapenem resistance NOT DETECTED NOT DETECTED Final   Haemophilus influenzae NOT DETECTED NOT DETECTED Final   Neisseria meningitidis NOT DETECTED NOT DETECTED Final   Pseudomonas aeruginosa NOT DETECTED NOT DETECTED Final   Candida albicans NOT DETECTED NOT DETECTED Final   Candida glabrata NOT DETECTED NOT DETECTED Final   Candida krusei NOT DETECTED NOT DETECTED Final   Candida parapsilosis NOT DETECTED NOT DETECTED Final   Candida tropicalis NOT DETECTED NOT DETECTED Final    Coagulation Studies: No results for input(s): LABPROT, INR in the last 72 hours.  Urinalysis: No results for input(s): COLORURINE, LABSPEC, PHURINE, GLUCOSEU, HGBUR, BILIRUBINUR, KETONESUR, PROTEINUR, UROBILINOGEN, NITRITE, LEUKOCYTESUR in the last 72 hours.  Invalid input(s): APPERANCEUR    Imaging: No results found.   Medications:   . sodium chloride 100 mL/hr at 06/14/16 1802   . cloNIDine  0.1 mg Oral BID  . colchicine  0.6 mg Oral Q72H  . heparin subcutaneous  5,000 Units Subcutaneous Q8H  . insulin aspart  0-12 Units Subcutaneous TID AC & HS  . simvastatin  20 mg Oral Daily   acetaminophen **OR** acetaminophen, ipratropium-albuterol, ondansetron **OR** ondansetron (ZOFRAN) IV, traMADol  Assessment/ Plan:  55 y.o. male with a PMHX of long-standing diabetes type 2 with neuropathy, hypertension, hyperlipidemia, obesity, schizophrenia, gout, CKD stage  IV baseline Cr 3.6/egfr 20.  1. Acute renal failure/chronic kidney disease stage IV baseline creatinine 3.6 EGFR 20. We saw patient earlier in the year in May 2017 for an episode of acute renal failure. His creatinine came down to 3.6. His serologic workup was largely negative at that point in time with the exception of abnormal SPEP and UPEP. He did not come for his follow-up for his underlying chronic kidney disease due to inability to get to our [place] office. - Renal function has improved. BUN down to 66 with a creatinine of 3.92. He will need continued monitoring of his underlying renal insufficiency as an outpatient which we will schedule.   2. Anemia of chronic kidney disease. Continue to monitor CBC as an outpatient.  3. Hypotension. Blood pressure currently 114/90. Patient off of many of his outpatient antihypertensives. Continue clonidine for now as blood pressure is acceptable.  LOS: 3 Caleb Hughes 8/17/201711:55 AM

## 2016-06-15 NOTE — Discharge Summary (Signed)
[facility] [place] NAME: Caleb Hughes    MR#:  [id number]  DATE OF BIRTH:  04/11/1961  DATE OF ADMISSION:  06/12/2016 ADMITTING PHYSICIAN: Lytle Butte, MD  DATE OF DISCHARGE: 06/15/2016  1:03 PM  PRIMARY CARE PHYSICIAN: [facility]    ADMISSION DIAGNOSIS:  Community acquired pneumonia [J18.9] Right sided abdominal pain [R10.9]  DISCHARGE DIAGNOSIS:  Active Problems:   AKI (acute kidney injury) ([place])   SECONDARY DIAGNOSIS:   Past Medical History:  Diagnosis Date  . Diabetes mellitus without complication ([place])   . Gout   . Hyperlipemia   . Hypertension   . Morbid obesity [facility])     HOSPITAL COURSE:   55 year old male with past medical history of chronic kidney disease stage III, diabetes, obesity, hypertension, history of gout, hyperlipidemia presented to the hospital due to right-sided chest pain and also noted to be in acute on chronic renal failure.  1. Chest pain - Musculoskeletal in nature. Patient CT renal study did show a suspected right-sided pneumonia, but the patient has no clinical symptoms and therefore he was not treated for a pneumonia. -His pain is improved with some as needed tramadol which she will continue upon discharge.  2. Acute on chronic renal failure-baseline creatinine is close to 3.5 and patient presented to the hospital with a creatinine of 4.5. Cr.  -Patient was given some gentle IV fluid hydration and his creatinine is now down to 3.9. He was seen by nephrology and he will follow up with them as an outpatient. -Patient has chronic kidney disease probably related to his underlying diabetes.   3. Diabetes type 2 with renal complications-patient will resume his NovoLog 7030 mix upon discharge.  4. Gout-no acute attack -We will continue his allopurinol.  5. Hyperlipidemia- he will continue Zocor.  6. Essential hypertension-patient's antihypertensive regimen was adjusted. -He is only  being discharged on some small dose clonidine. He was taken off his Lasix, chlorthalidone, Lisinopril.   DISCHARGE CONDITIONS:   Stable  CONSULTS OBTAINED:  Treatment Team:  Munsoor Holley Raring, MD  DRUG ALLERGIES:  No Known Allergies  DISCHARGE MEDICATIONS:     Medication List    STOP taking these medications   amLODipine 10 MG tablet Commonly known as:  NORVASC   chlorthalidone 25 MG tablet Commonly known as:  HYGROTON   COLCRYS 0.6 MG tablet Generic drug:  colchicine   furosemide 20 MG tablet Commonly known as:  LASIX   lisinopril 10 MG tablet Commonly known as:  PRINIVIL,ZESTRIL   predniSONE 5 MG tablet Commonly known as:  DELTASONE     TAKE these medications   allopurinol 100 MG tablet Commonly known as:  ZYLOPRIM Take 0.5 tablets (50 mg total) by mouth daily.   cloNIDine 0.1 MG tablet Commonly known as:  CATAPRES Take 1 tablet (0.1 mg total) by mouth 2 (two) times daily. What changed:  medication strength  how much to take  when to take this   NOVOLOG MIX 70/30 (70-30) 100 UNIT/ML injection Generic drug:  insulin aspart protamine- aspart Inject 0.08 mLs (8 Units total) into the skin 2 (two) times daily with a meal. What changed:  how much to take  when to take this   simvastatin 20 MG tablet Commonly known as:  ZOCOR Take 1 tablet by mouth daily.   sodium bicarbonate 650 MG tablet Take 2 tablets (1,300 mg total) by mouth 2 (two) times daily.   traMADol 50 MG tablet Commonly known  as:  ULTRAM Take 1 tablet (50 mg total) by mouth every 6 (six) hours as needed for moderate pain.         DISCHARGE INSTRUCTIONS:   DIET:  Cardiac diet and Diabetic diet  DISCHARGE CONDITION:  Stable  ACTIVITY:  Activity as tolerated  OXYGEN:  Home Oxygen: No.   Oxygen Delivery: room air  DISCHARGE LOCATION:  home   If you experience worsening of your admission symptoms, develop shortness of breath, life threatening emergency, suicidal or  homicidal thoughts you must seek medical attention immediately by calling 911 or calling your MD immediately  if symptoms less severe.  You Must read complete instructions/literature along with all the possible adverse reactions/side effects for all the Medicines you take and that have been prescribed to you. Take any new Medicines after you have completely understood and accpet all the possible adverse reactions/side effects.   Please note  You were cared for by a hospitalist during your hospital stay. If you have any questions about your discharge medications or the care you received while you were in the hospital after you are discharged, you can call the unit and asked to speak with the hospitalist on call if the hospitalist that took care of you is not available. Once you are discharged, your primary care physician will handle any further medical issues. Please note that NO REFILLS for any discharge medications will be authorized once you are discharged, as it is imperative that you return to your primary care physician (or establish a relationship with a primary care physician if you do not have one) for your aftercare needs so that they can reassess your need for medications and monitor your lab values.     Today   Still having some right sided shoulder pain.  Renal function improved. No other complaints.   VITAL SIGNS:  Blood pressure 114/90, pulse (!) 105, temperature 99.4 F (37.4 C), temperature source Oral, resp. rate 16, height 6\' 2"  (1.88 m), weight 122.5 kg (270 lb), SpO2 94 %.  I/O:   Intake/Output Summary (Last 24 hours) at 06/15/16 1601 Last data filed at 06/15/16 0515  Gross per 24 hour  Intake             2216 ml  Output             1225 ml  Net              991 ml    PHYSICAL EXAMINATION:  GENERAL:  55 y.o.-year-old patient lying in the bed with no acute distress.  EYES: Pupils equal, round, reactive to light and accommodation. No scleral icterus. Extraocular  muscles intact.  HEENT: Head atraumatic, normocephalic. Oropharynx and nasopharynx clear.  NECK:  Supple, no jugular venous distention. No thyroid enlargement, no tenderness.  LUNGS: Normal breath sounds bilaterally, no wheezing, rales,rhonchi. No use of accessory muscles of respiration.  CARDIOVASCULAR: S1, S2 normal. No murmurs, rubs, or gallops.  ABDOMEN: Soft, non-tender, non-distended. Bowel sounds present. No organomegaly or mass.  EXTREMITIES: No pedal edema, cyanosis, or clubbing.  NEUROLOGIC: Cranial nerves II through XII are intact. No focal motor or sensory defecits b/l.  PSYCHIATRIC: The patient is alert and oriented x 3. Good affect.  SKIN: No obvious rash, lesion, or ulcer.   DATA REVIEW:   CBC  Recent Labs Lab 06/13/16 0503  WBC 6.9  HGB 11.8*  HCT 35.2*  PLT 153    Chemistries   Recent Labs Lab 06/12/16 2046  06/15/16 0412  NA  --   < > 139  K  --   < > 4.2  CL  --   < > 111  CO2  --   < > 20*  GLUCOSE  --   < > 94  BUN  --   < > 66*  CREATININE  --   < > 3.92*  CALCIUM  --   < > 8.9  AST 16  --   --   ALT 12*  --   --   ALKPHOS 55  --   --   BILITOT 0.4  --   --   < > = values in this interval not displayed.  Cardiac Enzymes  Recent Labs Lab 06/12/16 1859  TROPONINI <0.03    Microbiology Results  Results for orders placed or performed during the hospital encounter of 06/12/16  Culture, blood (Routine X 2) w Reflex to ID Panel     Status: Abnormal (Preliminary result)   Collection Time: 06/12/16 10:15 PM  Result Value Ref Range Status   Specimen Description BLOOD BLOOD LEFT FOREARM  Final   Special Requests   Final    BOTTLES DRAWN AEROBIC AND ANAEROBIC 6CC AERO 5CC ANA   Culture  Setup Time   Final    GRAM POSITIVE COCCI IN BOTH AEROBIC AND ANAEROBIC BOTTLES CRITICAL RESULT CALLED TO, READ BACK BY AND VERIFIED WITH: [place] AT [id number] 06/14/16 SDR Organism ID to follow    Culture (A)  Final    STAPHYLOCOCCUS SPECIES (COAGULASE  NEGATIVE) THE SIGNIFICANCE OF ISOLATING THIS ORGANISM FROM A SINGLE SET OF BLOOD CULTURES WHEN MULTIPLE SETS ARE DRAWN IS UNCERTAIN. PLEASE NOTIFY THE MICROBIOLOGY DEPARTMENT WITHIN ONE WEEK IF SPECIATION AND SENSITIVITIES ARE REQUIRED. Performed at [facility]    Report Status PENDING  Incomplete  Culture, blood (Routine X 2) w Reflex to ID Panel     Status: None (Preliminary result)   Collection Time: 06/12/16 10:15 PM  Result Value Ref Range Status   Specimen Description BLOOD LEFT ASSIST CONTROL  Final   Special Requests BOTTLES DRAWN AEROBIC AND ANAEROBIC 6CC  Final   Culture NO GROWTH 2 DAYS  Final   Report Status PENDING  Incomplete  Blood Culture ID Panel (Reflexed)     Status: Abnormal   Collection Time: 06/12/16 10:15 PM  Result Value Ref Range Status   Enterococcus species NOT DETECTED NOT DETECTED Final   Vancomycin resistance NOT DETECTED NOT DETECTED Final   Listeria monocytogenes NOT DETECTED NOT DETECTED Final   Staphylococcus species DETECTED (A) NOT DETECTED Final    Comment: CRITICAL RESULT CALLED TO, READ BACK BY AND VERIFIED WITH: NATE COOKSON AT 0546 06/14/16 SDR    Staphylococcus aureus NOT DETECTED NOT DETECTED Final   Methicillin resistance NOT DETECTED NOT DETECTED Final   Streptococcus species NOT DETECTED NOT DETECTED Final   Streptococcus agalactiae NOT DETECTED NOT DETECTED Final   Streptococcus pneumoniae NOT DETECTED NOT DETECTED Final   Streptococcus pyogenes NOT DETECTED NOT DETECTED Final   Acinetobacter baumannii NOT DETECTED NOT DETECTED Final   Enterobacteriaceae species NOT DETECTED NOT DETECTED Final   Enterobacter cloacae complex NOT DETECTED NOT DETECTED Final   Escherichia coli NOT DETECTED NOT DETECTED Final   Klebsiella oxytoca NOT DETECTED NOT DETECTED Final   Klebsiella pneumoniae NOT DETECTED NOT DETECTED Final   Proteus species NOT DETECTED NOT DETECTED Final   Serratia marcescens NOT DETECTED NOT DETECTED Final   Carbapenem  resistance NOT DETECTED NOT DETECTED Final  Haemophilus influenzae NOT DETECTED NOT DETECTED Final   Neisseria meningitidis NOT DETECTED NOT DETECTED Final   Pseudomonas aeruginosa NOT DETECTED NOT DETECTED Final   Candida albicans NOT DETECTED NOT DETECTED Final   Candida glabrata NOT DETECTED NOT DETECTED Final   Candida krusei NOT DETECTED NOT DETECTED Final   Candida parapsilosis NOT DETECTED NOT DETECTED Final   Candida tropicalis NOT DETECTED NOT DETECTED Final    RADIOLOGY:  No results found.    Management plans discussed with the patient, family and they are in agreement.  CODE STATUS:     Code Status Orders        Start     Ordered   06/12/16 2356  Full code  Continuous     06/12/16 2355    Code Status History    Date Active Date Inactive Code Status Order ID Comments User Context   03/13/2016  3:12 AM 03/16/2016  2:11 PM Full Code [id number]  Idelle Crouch, MD Inpatient      TOTAL TIME TAKING CARE OF THIS PATIENT: 40 minutes.    Henreitta Leber M.D on 06/15/2016 at 4:01 PM  Between 7am to 6pm - Pager - [phone]  After 6pm go to [email or web address] - [employer] [facility]  Office  [phone]  CC: Primary care physician; [place]

## 2016-06-16 LAB — CULTURE, BLOOD (ROUTINE X 2)

## 2016-06-18 LAB — CULTURE, BLOOD (ROUTINE X 2): Culture: NO GROWTH

## 2016-07-04 ENCOUNTER — Emergency Department
Admission: EM | Admit: 2016-07-04 | Discharge: 2016-07-05 | Disposition: A | Discharge status: Home / Self Care | Payer: Medicare Other | Attending: Emergency Medicine | Admitting: Emergency Medicine

## 2016-07-04 ENCOUNTER — Emergency Department: Payer: Medicare Other

## 2016-07-04 ENCOUNTER — Encounter: Payer: Self-pay | Admitting: Emergency Medicine

## 2016-07-04 DIAGNOSIS — Y999 Unspecified external cause status: Secondary | ICD-10-CM | POA: Diagnosis not present

## 2016-07-04 DIAGNOSIS — Z794 Long term (current) use of insulin: Secondary | ICD-10-CM | POA: Diagnosis not present

## 2016-07-04 DIAGNOSIS — I10 Essential (primary) hypertension: Secondary | ICD-10-CM | POA: Insufficient documentation

## 2016-07-04 DIAGNOSIS — S39012A Strain of muscle, fascia and tendon of lower back, initial encounter: Secondary | ICD-10-CM | POA: Insufficient documentation

## 2016-07-04 DIAGNOSIS — Y92002 Bathroom of unspecified non-institutional (private) residence single-family (private) house as the place of occurrence of the external cause: Secondary | ICD-10-CM | POA: Diagnosis not present

## 2016-07-04 DIAGNOSIS — W1839XA Other fall on same level, initial encounter: Secondary | ICD-10-CM | POA: Insufficient documentation

## 2016-07-04 DIAGNOSIS — S300XXA Contusion of lower back and pelvis, initial encounter: Secondary | ICD-10-CM

## 2016-07-04 DIAGNOSIS — E119 Type 2 diabetes mellitus without complications: Secondary | ICD-10-CM | POA: Diagnosis not present

## 2016-07-04 DIAGNOSIS — S3992XA Unspecified injury of lower back, initial encounter: Secondary | ICD-10-CM | POA: Diagnosis present

## 2016-07-04 DIAGNOSIS — Y939 Activity, unspecified: Secondary | ICD-10-CM | POA: Insufficient documentation

## 2016-07-04 MED ORDER — DIAZEPAM 5 MG PO TABS
5.0000 mg | ORAL_TABLET | Freq: Once | ORAL | Status: AC
  Administered 2016-07-04: 5 mg via ORAL
  Filled 2016-07-04: qty 1

## 2016-07-04 MED ORDER — TRAMADOL HCL 50 MG PO TABS: 50.0000 mg | ORAL_TABLET | Freq: Four times a day (QID) | ORAL | 0 refills | Status: AC | PRN

## 2016-07-04 MED ORDER — HYDROMORPHONE HCL 1 MG/ML IJ SOLN
1.0000 mg | Freq: Once | INTRAMUSCULAR | Status: AC
  Administered 2016-07-04: 1 mg via INTRAMUSCULAR
  Filled 2016-07-04: qty 1

## 2016-07-04 MED ORDER — BACLOFEN 10 MG PO TABS: 10.0000 mg | ORAL_TABLET | Freq: Three times a day (TID) | ORAL | 0 refills | Status: DC

## 2016-07-04 NOTE — ED Triage Notes (Signed)
Pt presents to triage with c/o lower back pain after falling tonight in bathroom and landing on the side of the tub on his back. Pt denies LOC or hitting head. Pt reports hx HTN. Pt reports increased pain with movement. Pt alert and oriented x 4, no increased work in breathing noted.

## 2016-07-04 NOTE — ED Provider Notes (Signed)
[facility] Emergency Department Provider Note  ____________________________________________  Time seen: Approximately 9:44 PM  I have reviewed the triage vital signs and the nursing notes.   HISTORY  Chief Complaint Back Pain    HPI Caleb Hughes is a 55 y.o. male presents for evaluation of lower back pain after falling The bathroom landing left side of his top and back. Patient denies loss consciousness or head injury. Complains of lower thoracic upper lumbar spine pain.   Past Medical History:  Diagnosis Date  . Diabetes mellitus without complication ([place])   . Gout   . Hyperlipemia   . Hypertension   . Morbid obesity [facility])     Patient Active Problem List   Diagnosis Date Noted  . AKI (acute kidney injury) ([place]) 06/12/2016  . 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 Elevated troponin 03/13/2016  . Hyperkalemia 03/13/2016  . Morbid obesity ([place]) 03/13/2016  . Hypertension   . Hyperlipemia   . Gout   . ARF (acute renal failure) ([place]) 03/12/2016  . Dehydration 03/12/2016  . Intractable nausea and vomiting 03/12/2016  . Type II diabetes mellitus with manifestations ([place]) 03/12/2016    History reviewed. No pertinent surgical history.  Prior to Admission medications   Medication Sig Start Date End Date Taking? Authorizing Provider  baclofen (LIORESAL) 10 MG tablet Take 1 tablet (10 mg total) by mouth 3 (three) times daily. 07/04/16   Pierce Crane Beers, PA-C  cloNIDine (CATAPRES) 0.1 MG tablet Take 1 tablet (0.1 mg total) by mouth 2 (two) times daily. 06/15/16   Henreitta Leber, MD  NOVOLOG MIX 70/30 (70-30) 100 UNIT/ML injection Inject 0.08 mLs (8 Units total) into the skin 2 (two) times daily with a meal. Patient taking differently: Inject 70 Units into the skin 3 (three) times daily with meals.  03/16/16   Loletha Grayer, MD  simvastatin (ZOCOR) 20 MG tablet Take 1 tablet by mouth daily. 02/29/16   Historical Provider, MD   traMADol (ULTRAM) 50 MG tablet Take 1 tablet (50 mg total) by mouth every 6 (six) hours as needed. 07/04/16 07/04/17  Arlyss Repress, PA-C    Allergies Review of patient's allergies indicates no known allergies.  Family History  Problem Relation Age of Onset  . Hypertension Mother   . Diabetes type II Mother   . CAD Father   . Hypertension Father   . Alcohol abuse Father   . Alcohol abuse Brother   . Alcohol abuse Brother   . Kidney failure Brother   . Kidney failure Brother     Social History Social History  Substance Use Topics  . Smoking status: Never Smoker  . Smokeless tobacco: Never Used  . Alcohol use No    Review of Systems Constitutional: No fever/chills Cardiovascular: Denies chest pain. Respiratory: Denies shortness of breath. Musculoskeletal: Positive for back pain. Skin: Negative for rash. Neurological: Negative for headaches, focal weakness or numbness.  10-point ROS otherwise negative.  ____________________________________________   PHYSICAL EXAM:  VITAL SIGNS: ED Triage Vitals  Enc Vitals Group     BP 07/04/16 2059 (!) 164/103     Pulse Rate 07/04/16 2059 97     Resp 07/04/16 2059 18     Temp 07/04/16 2059 99.7 F (37.6 C)     Temp Source 07/04/16 2059 Oral     SpO2 07/04/16 2059 100 %     Weight 07/04/16 2100 269 lb (122 kg)     Height 07/04/16 2100 6\' 2"  (  1.88 m)     Head Circumference --      Peak Flow --      Pain Score 07/04/16 2100 8     Pain Loc --      Pain Edu? --      Excl. in [place]? --     Constitutional: Alert and oriented. Well appearing and in no acute distress. Head: Atraumatic. Neck: No stridor.   Cardiovascular: Normal rate, regular rhythm. Grossly normal heart sounds.  Good peripheral circulation. Respiratory: Normal respiratory effort.  No retractions. Lungs CTAB. Musculoskeletal: Point tenderness of thoracic lumbar spine area no ecchymosis or bruising noted. Trachea leg raise negative bilaterally. Neurologic:  Normal  speech and language. No gross focal neurologic deficits are appreciated. No gait instability. Skin:  Skin is warm, dry and intact. No rash noted. Psychiatric: Mood and affect are normal. Speech and behavior are normal.  ____________________________________________   LABS (all labs ordered are listed, but only abnormal results are displayed)  Labs Reviewed - No data to display ____________________________________________  EKG   ____________________________________________  RADIOLOGY  No acute osseous findings. ____________________________________________   PROCEDURES  Procedure(s) performed: None  Critical Care performed: No  ____________________________________________   INITIAL IMPRESSION / ASSESSMENT AND PLAN / ED COURSE  Pertinent labs & imaging results that were available during my care of the patient were reviewed by me and considered in my medical decision making (see chart for details).  Review of the [place] CSRS was performed in accordance of the [place] prior to dispensing any controlled drugs.  Status post fall with acute lumbar lower thoracic contusion. Baclofen 10 mg 3 times a day and tramadol 50 mg 4 times a day. Patient follow-up PCP or return to ER with any worsening symptomology.  Clinical Course    ____________________________________________   FINAL CLINICAL IMPRESSION(S) / ED DIAGNOSES  Final diagnoses:  Lumbar strain, initial encounter  Lumbar contusion, initial encounter     This chart was dictated using voice recognition software/Dragon. Despite best efforts to proofread, errors can occur which can change the meaning. Any change was purely unintentional.    Arlyss Repress, PA-C 07/04/16 [place], PA-C 07/04/16 2246    Carrie Mew, MD 07/04/16 [phone]

## 2016-07-08 ENCOUNTER — Emergency Department: Payer: Medicare Other

## 2016-07-08 ENCOUNTER — Ambulatory Visit (HOSPITAL_COMMUNITY)
Admission: AD | Admit: 2016-07-08 | Discharge: 2016-07-08 | Disposition: A | Discharge status: Home / Self Care | Payer: Medicare Other | Source: Other Acute Inpatient Hospital | Attending: Emergency Medicine | Admitting: Emergency Medicine

## 2016-07-08 ENCOUNTER — Emergency Department
Admission: EM | Admit: 2016-07-08 | Discharge: 2016-07-08 | Disposition: A | Discharge status: Other Acute Inpatient Hospital | Payer: Medicare Other | Attending: Emergency Medicine | Admitting: Emergency Medicine

## 2016-07-08 DIAGNOSIS — I609 Nontraumatic subarachnoid hemorrhage, unspecified: Secondary | ICD-10-CM | POA: Diagnosis not present

## 2016-07-08 DIAGNOSIS — R569 Unspecified convulsions: Secondary | ICD-10-CM | POA: Insufficient documentation

## 2016-07-08 DIAGNOSIS — E119 Type 2 diabetes mellitus without complications: Secondary | ICD-10-CM | POA: Insufficient documentation

## 2016-07-08 DIAGNOSIS — I10 Essential (primary) hypertension: Secondary | ICD-10-CM | POA: Diagnosis not present

## 2016-07-08 DIAGNOSIS — Z794 Long term (current) use of insulin: Secondary | ICD-10-CM | POA: Insufficient documentation

## 2016-07-08 DIAGNOSIS — R4182 Altered mental status, unspecified: Secondary | ICD-10-CM | POA: Diagnosis present

## 2016-07-08 DIAGNOSIS — W19XXXA Unspecified fall, initial encounter: Secondary | ICD-10-CM

## 2016-07-08 LAB — COMPREHENSIVE METABOLIC PANEL
ALK PHOS: 62 U/L (ref 38–126)
ALT: 12 U/L — AB (ref 17–63)
AST: 28 U/L (ref 15–41)
Albumin: 3.9 g/dL (ref 3.5–5.0)
Anion gap: 17 — ABNORMAL HIGH (ref 5–15)
BUN: 54 mg/dL — AB (ref 6–20)
CALCIUM: 9.9 mg/dL (ref 8.9–10.3)
CO2: 17 mmol/L — AB (ref 22–32)
CREATININE: 4.64 mg/dL — AB (ref 0.61–1.24)
Chloride: 110 mmol/L (ref 101–111)
GFR calc non Af Amer: 13 mL/min — ABNORMAL LOW (ref >60)
GFR, EST AFRICAN AMERICAN: 15 mL/min — AB (ref >60)
Glucose, Bld: 214 mg/dL — ABNORMAL HIGH (ref 65–99)
Potassium: 3.7 mmol/L (ref 3.5–5.1)
SODIUM: 144 mmol/L (ref 135–145)
Total Bilirubin: 0.4 mg/dL (ref 0.3–1.2)
Total Protein: 8.5 g/dL — ABNORMAL HIGH (ref 6.5–8.1)

## 2016-07-08 LAB — CBC
HCT: 44.1 % (ref 40.0–52.0)
HEMOGLOBIN: 14 g/dL (ref 13.0–18.0)
MCH: 24.5 pg — AB (ref 26.0–34.0)
MCHC: 31.7 g/dL — ABNORMAL LOW (ref 32.0–36.0)
MCV: 77.4 fL — ABNORMAL LOW (ref 80.0–100.0)
Platelets: 364 10*3/uL (ref 150–440)
RBC: 5.69 MIL/uL (ref 4.40–5.90)
RDW: 15.5 % — ABNORMAL HIGH (ref 11.5–14.5)
WBC: 15.1 10*3/uL — ABNORMAL HIGH (ref 3.8–10.6)

## 2016-07-08 LAB — PROTIME-INR
INR: 1.12
Prothrombin Time: 14.5 seconds (ref 11.4–15.2)

## 2016-07-08 MED ORDER — NICARDIPINE HCL IN NACL 20-0.86 MG/200ML-% IV SOLN
3.0000 mg/h | INTRAVENOUS | Status: DC
  Administered 2016-07-08: 5 mg/h via INTRAVENOUS

## 2016-07-08 MED ORDER — MIDAZOLAM HCL 2 MG/2ML IJ SOLN
5.0000 mg | Freq: Once | INTRAMUSCULAR | Status: AC
  Administered 2016-07-08: 5 mg via INTRAMUSCULAR

## 2016-07-08 MED ORDER — SODIUM CHLORIDE 0.9 % IV SOLN
1500.0000 mg | Freq: Once | INTRAVENOUS | Status: AC
  Administered 2016-07-08: 1500 mg via INTRAVENOUS
  Filled 2016-07-08: qty 15

## 2016-07-08 MED ORDER — LORAZEPAM 2 MG/ML IJ SOLN
1.0000 mg | Freq: Once | INTRAMUSCULAR | Status: AC
  Administered 2016-07-08: 1 mg via INTRAVENOUS
  Filled 2016-07-08: qty 1

## 2016-07-08 MED ORDER — SODIUM CHLORIDE 0.9 % IV BOLUS (SEPSIS)
1000.0000 mL | Freq: Once | INTRAVENOUS | Status: AC
  Administered 2016-07-08: 1000 mL via INTRAVENOUS

## 2016-07-08 MED ORDER — LABETALOL HCL 5 MG/ML IV SOLN
20.0000 mg | Freq: Once | INTRAVENOUS | Status: AC
  Administered 2016-07-08: 20 mg via INTRAVENOUS
  Filled 2016-07-08: qty 4

## 2016-07-08 MED ORDER — MIDAZOLAM HCL 2 MG/2ML IJ SOLN
2.0000 mg | Freq: Once | INTRAMUSCULAR | Status: AC
  Administered 2016-07-08: 2 mg via INTRAVENOUS

## 2016-07-08 MED ORDER — MIDAZOLAM HCL 2 MG/2ML IJ SOLN
INTRAMUSCULAR | Status: AC
  Administered 2016-07-08: 2 mg via INTRAVENOUS
  Filled 2016-07-08: qty 2

## 2016-07-08 NOTE — ED Notes (Signed)
Patient transported to CT 

## 2016-07-08 NOTE — ED Notes (Signed)
Pt taken to CT, [employer] with patient

## 2016-07-08 NOTE — ED Provider Notes (Addendum)
[facility] Emergency Department Provider Note  Time seen: 3:48 PM  I have reviewed the triage vital signs and the nursing notes.   HISTORY  Chief Complaint Altered Mental Status    HPI Caleb Hughes is a 55 y.o. male with a past medical history of diabetes, hypertension, hyperlipidemia, obesity, who presents the emergency department after a seizure. According to EMS report they were called out to the patient's residence for a seizure. Per EMS the patient has no seizure history, does not take any seizure medications, is not known to be an alcohol drinker, but this is unclear at this time. Patient was seen in the emergency department 4 days ago for a fall and back pain. Patient was prescribed tramadol and baclofen. Currently the patient is somewhat combative/postictal, cannot contribute to the history, will not answer questions or follow commands. Patient occasionally curses and states "come on man" and tries to get up.  Past Medical History:  Diagnosis Date  . Diabetes mellitus without complication ([place])   . Gout   . Hyperlipemia   . Hypertension   . Morbid obesity [facility])     Patient Active Problem List   Diagnosis Date Noted  . AKI (acute kidney injury) ([place]) 06/12/2016  . 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 Elevated troponin 03/13/2016  . Hyperkalemia 03/13/2016  . Morbid obesity ([place]) 03/13/2016  . Hypertension   . Hyperlipemia   . Gout   . ARF (acute renal failure) ([place]) 03/12/2016  . Dehydration 03/12/2016  . Intractable nausea and vomiting 03/12/2016  . Type II diabetes mellitus with manifestations ([place]) 03/12/2016    No past surgical history on file.  Prior to Admission medications   Medication Sig Start Date End Date Taking? Authorizing Provider  baclofen (LIORESAL) 10 MG tablet Take 1 tablet (10 mg total) by mouth 3 (three) times daily. 07/04/16   Pierce Crane Beers, PA-C  cloNIDine (CATAPRES) 0.1 MG tablet Take 1  tablet (0.1 mg total) by mouth 2 (two) times daily. 06/15/16   Henreitta Leber, MD  NOVOLOG MIX 70/30 (70-30) 100 UNIT/ML injection Inject 0.08 mLs (8 Units total) into the skin 2 (two) times daily with a meal. Patient taking differently: Inject 70 Units into the skin 3 (three) times daily with meals.  03/16/16   Loletha Grayer, MD  simvastatin (ZOCOR) 20 MG tablet Take 1 tablet by mouth daily. 02/29/16   Historical Provider, MD  traMADol (ULTRAM) 50 MG tablet Take 1 tablet (50 mg total) by mouth every 6 (six) hours as needed. 07/04/16 07/04/17  Pierce Crane Beers, PA-C    No Known Allergies  Family History  Problem Relation Age of Onset  . Hypertension Mother   . Diabetes type II Mother   . CAD Father   . Hypertension Father   . Alcohol abuse Father   . Alcohol abuse Brother   . Alcohol abuse Brother   . Kidney failure Brother   . Kidney failure Brother     Social History Social History  Substance Use Topics  . Smoking status: Never Smoker  . Smokeless tobacco: Never Used  . Alcohol use No    Review of Systems Unable to perform an adequate review of systems due to altered mental status/postictal.  ____________________________________________   PHYSICAL EXAM:  VITAL SIGNS: ED Triage Vitals [07/08/16 1547]  Enc Vitals Group     BP (!) 169/123     Pulse Rate (!) 140     Resp 20  Temp 97.6 F (36.4 C)     Temp Source Axillary     SpO2 95 %     Weight      Height      Head Circumference      Peak Flow      Pain Score      Pain Loc      Pain Edu?      Excl. in [place]?     Constitutional: Alert, will look around occasionally make eye contact, moves all extremities, appears confused/combative. Will not answer questions or follow specific commands. Eyes: 2-3 MM bilaterally, PERRL. ENT   Head: Normocephalic and atraumatic.   Mouth/Throat: Mucous membranes are moist. Small left-sided tongue laceration. Cardiovascular: Regular rhythm rate around 150 bpm. Respiratory:  Mild tachypnea, breath sounds are clear bilaterally. Gastrointestinal: Soft and nontender. No distention. Musculoskeletal: Nontender with normal range of motion in all extremities. Neurologic:  Patient appears to be confused/postictal, when he does speak it is clear. Moves all extremities. Appears to have great strength in all extremities. Skin:  Skin is warm, intact, moderate diaphoresis. Psychiatric: Unable to assess, post ictal.  ____________________________________________   RADIOLOGY  CT scan consistent with subarachnoid hemorrhage.  ____________________________________________   INITIAL IMPRESSION / ASSESSMENT AND PLAN / ED COURSE  Pertinent labs & imaging results that were available during my care of the patient were reviewed by me and considered in my medical decision making (see chart for details).  Patient presents to the emergency department with a likely first time seizure. No known seizure history. Of note the patient was prescribed baclofen and tramadol 4 days ago for a fall/back pain. Given the recent history of fall now the first time seizure we'll proceed with lab work including urine drug strength, obtaining CT scan of the head, and monitor closely in the emergency department. As the patient recently started tramadol the seizure could very likely be from the tramadol use. Initially patient very agitated/combative upon arrival, I ordered 5 mg of IM Versed.  CT scan consistent with subarachnoid hemorrhage. It is unclear whether the patient had a subarachnoid hemorrhage 4 days ago from the fall versus a seizure today causing him to fall suffering a subarachnoid hemorrhage. Either way the patient requires a higher level of care than we are able to offer. I've ordered a Keppra loading dose for the patient. Patient is somnolent, but able to answer questions, I informed him of the brain bleed he seems to comprehend this, I told him he would need to go to [facility] and he states  okay. Patient is calm and cooperative at this time, no airway issues. We'll transfer the patient to [facility] neurologic intensive care unit.  CRITICAL CARE Performed by: Harvest Dark   Total critical care time: 60 minutes  Critical care time was exclusive of separately billable procedures and treating other patients.  Critical care was necessary to treat or prevent imminent or life-threatening deterioration.  Critical care was time spent personally by me on the following activities: development of treatment plan with patient and/or surrogate as well as nursing, discussions with consultants, evaluation of patient's response to treatment, examination of patient, obtaining history from patient or surrogate, ordering and performing treatments and interventions, ordering and review of laboratory studies, ordering and review of radiographic studies, pulse oximetry and re-evaluation of patient's condition.   ----------------------------------------- 6:49 PM on 07/08/2016 -----------------------------------------  Blood pressure is now elevated cratered [id number] systolic, heart rate of [id number] bpm, we will dose 20 mg of labetalol  IV push.   Blood pressure remained elevated after labetalol push. Started on nicardipine drip. Patient transferred to [facility] without further issue.     ____________________________________________   FINAL CLINICAL IMPRESSION(S) / ED DIAGNOSES  Seizure Subarachnoid hemorrhage    Harvest Dark, MD 07/08/16 [place], MD 07/08/16 2321

## 2016-07-08 NOTE — ED Notes (Signed)
Pt returned from CT. Pt not communicating. Pt responds to to voice.

## 2016-07-08 NOTE — ED Triage Notes (Signed)
Pt arrives via ems, from home, pt was witnessed to have a grand mal seizure at home, pt is very combative and confused for EMS and cont to swing at staff and mumble upon arrival to ER, Dr Kerman Passey at bedside

## 2016-10-04 ENCOUNTER — Emergency Department: Payer: Medicare Other

## 2016-10-04 ENCOUNTER — Emergency Department
Admission: EM | Admit: 2016-10-04 | Discharge: 2016-10-04 | Disposition: A | Discharge status: Home / Self Care | Payer: Medicare Other | Attending: Emergency Medicine | Admitting: Emergency Medicine

## 2016-10-04 ENCOUNTER — Encounter: Payer: Self-pay | Admitting: Emergency Medicine

## 2016-10-04 DIAGNOSIS — E119 Type 2 diabetes mellitus without complications: Secondary | ICD-10-CM | POA: Diagnosis not present

## 2016-10-04 DIAGNOSIS — Z7982 Long term (current) use of aspirin: Secondary | ICD-10-CM | POA: Insufficient documentation

## 2016-10-04 DIAGNOSIS — Z79899 Other long term (current) drug therapy: Secondary | ICD-10-CM | POA: Insufficient documentation

## 2016-10-04 DIAGNOSIS — I10 Essential (primary) hypertension: Secondary | ICD-10-CM | POA: Diagnosis not present

## 2016-10-04 DIAGNOSIS — R112 Nausea with vomiting, unspecified: Secondary | ICD-10-CM | POA: Insufficient documentation

## 2016-10-04 LAB — COMPREHENSIVE METABOLIC PANEL
ALT: 24 U/L (ref 17–63)
AST: 22 U/L (ref 15–41)
Albumin: 2.6 g/dL — ABNORMAL LOW (ref 3.5–5.0)
Alkaline Phosphatase: 102 U/L (ref 38–126)
Anion gap: 12 (ref 5–15)
BILIRUBIN TOTAL: 0.5 mg/dL (ref 0.3–1.2)
BUN: 43 mg/dL — AB (ref 6–20)
CHLORIDE: 99 mmol/L — AB (ref 101–111)
CO2: 26 mmol/L (ref 22–32)
CREATININE: 4.62 mg/dL — AB (ref 0.61–1.24)
Calcium: 9 mg/dL (ref 8.9–10.3)
GFR, EST AFRICAN AMERICAN: 15 mL/min — AB (ref >60)
GFR, EST NON AFRICAN AMERICAN: 13 mL/min — AB (ref >60)
Glucose, Bld: 118 mg/dL — ABNORMAL HIGH (ref 65–99)
Potassium: 3.6 mmol/L (ref 3.5–5.1)
Sodium: 137 mmol/L (ref 135–145)
TOTAL PROTEIN: 7.2 g/dL (ref 6.5–8.1)

## 2016-10-04 LAB — CBC
HCT: 26.6 % — ABNORMAL LOW (ref 40.0–52.0)
Hemoglobin: 8.6 g/dL — ABNORMAL LOW (ref 13.0–18.0)
MCH: 25.4 pg — ABNORMAL LOW (ref 26.0–34.0)
MCHC: 32.5 g/dL (ref 32.0–36.0)
MCV: 78.1 fL — ABNORMAL LOW (ref 80.0–100.0)
PLATELETS: 306 10*3/uL (ref 150–440)
RBC: 3.41 MIL/uL — AB (ref 4.40–5.90)
RDW: 16.1 % — AB (ref 11.5–14.5)
WBC: 10.2 10*3/uL (ref 3.8–10.6)

## 2016-10-04 LAB — LIPASE, BLOOD: LIPASE: 41 U/L (ref 11–51)

## 2016-10-04 LAB — TROPONIN I

## 2016-10-04 MED ORDER — ONDANSETRON HCL 4 MG PO TABS: 4.0000 mg | ORAL_TABLET | Freq: Every day | ORAL | 0 refills | Status: DC | PRN

## 2016-10-04 MED ORDER — GI COCKTAIL ~~LOC~~
30.0000 mL | Freq: Once | ORAL | Status: AC
  Administered 2016-10-04: 30 mL via ORAL
  Filled 2016-10-04: qty 30

## 2016-10-04 MED ORDER — ONDANSETRON HCL 4 MG PO TABS
4.0000 mg | ORAL_TABLET | Freq: Once | ORAL | Status: AC
  Administered 2016-10-04: 4 mg via ORAL
  Filled 2016-10-04: qty 1

## 2016-10-04 NOTE — ED Provider Notes (Signed)
[facility] Emergency Department Provider Note  ____________________________________________   First MD Initiated Contact with Patient 10/04/16 1212     (approximate)  I have reviewed the triage vital signs and the nursing notes.   HISTORY  Chief Complaint Emesis   HPI Caleb Hughes is a 55 y.o. male with a history of diabetes as well as a subarachnoid hemorrhage and subsequent acute kidney injury now on dialysis was presented to the emergency department with vomiting. He denies any pain but still says that he feels mildly nauseous. Last vomiting episode was last night when he vomited coffee grounds. He denies any diarrhea or blood in the stool. Denies any pain at this time. Patient says that he is a mild burning sensation as well in his chest that does not radiate. Says that it "feels like heartburn."   Past Medical History:  Diagnosis Date  . Diabetes mellitus without complication ([place])   . Gout   . Hyperlipemia   . Hypertension   . Morbid obesity [facility])     Patient Active Problem List   Diagnosis Date Noted  . AKI (acute kidney injury) ([place]) 06/12/2016  . 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 Elevated troponin 03/13/2016  . Hyperkalemia 03/13/2016  . Morbid obesity ([place]) 03/13/2016  . Hypertension   . Hyperlipemia   . Gout   . ARF (acute renal failure) ([place]) 03/12/2016  . Dehydration 03/12/2016  . Intractable nausea and vomiting 03/12/2016  . Type II diabetes mellitus with manifestations ([place]) 03/12/2016    No past surgical history on file.  Prior to Admission medications   Medication Sig Start Date End Date Taking? Authorizing Provider  amLODipine (NORVASC) 10 MG tablet Take 10 mg by mouth daily.   Yes Historical Provider, MD  aspirin EC 81 MG tablet Take 81 mg by mouth daily.   Yes Historical Provider, MD  atorvastatin (LIPITOR) 80 MG tablet Take 80 mg by mouth daily.   Yes Historical Provider, MD    levETIRAcetam (KEPPRA) 500 MG tablet Take 500 mg by mouth 3 (three) times a week. Tues,thurs,saturday   Yes Historical Provider, MD  Melatonin 3 MG TABS Take 1 tablet by mouth daily.   Yes Historical Provider, MD  pantoprazole (PROTONIX) 40 MG tablet Take 40 mg by mouth daily.   Yes Historical Provider, MD  polyethylene glycol (MIRALAX / GLYCOLAX) packet Take 17 g by mouth daily.   Yes Historical Provider, MD  predniSONE (DELTASONE) 10 MG tablet Take 10 mg by mouth daily. 40mg  10/05/2016 30mg  10/06/2016 20mg  10/07/2016 10mg  10/08/2016   Yes Historical Provider, MD  senna (SENOKOT) 8.6 MG TABS tablet Take 1 tablet by mouth at bedtime.   Yes Historical Provider, MD  baclofen (LIORESAL) 10 MG tablet Take 1 tablet (10 mg total) by mouth 3 (three) times daily. Patient not taking: Reported on 10/04/2016 07/04/16   Pierce Crane Beers, PA-C  cloNIDine (CATAPRES) 0.1 MG tablet Take 1 tablet (0.1 mg total) by mouth 2 (two) times daily. Patient not taking: Reported on 10/04/2016 06/15/16   Henreitta Leber, MD  NOVOLOG MIX 70/30 (70-30) 100 UNIT/ML injection Inject 0.08 mLs (8 Units total) into the skin 2 (two) times daily with a meal. Patient not taking: Reported on 10/04/2016 03/16/16   Loletha Grayer, MD  traMADol (ULTRAM) 50 MG tablet Take 1 tablet (50 mg total) by mouth every 6 (six) hours as needed. Patient not taking: Reported on 10/04/2016 07/04/16 07/04/17  Arlyss Repress, PA-C  Allergies Patient has no known allergies.  Family History  Problem Relation Age of Onset  . Hypertension Mother   . Diabetes type II Mother   . CAD Father   . Hypertension Father   . Alcohol abuse Father   . Alcohol abuse Brother   . Alcohol abuse Brother   . Kidney failure Brother   . Kidney failure Brother     Social History Social History  Substance Use Topics  . Smoking status: Never Smoker  . Smokeless tobacco: Never Used  . Alcohol use No    Review of Systems Constitutional: No fever/chills Eyes: No  visual changes. ENT: No sore throat. Cardiovascular: As above Respiratory: Denies shortness of breath. Gastrointestinal: No abdominal pain.   No diarrhea.  No constipation. Genitourinary: Negative for dysuria. Musculoskeletal: Negative for back pain. Skin: Negative for rash. Neurological: Negative for headaches, focal weakness or numbness.  10-point ROS otherwise negative.  ____________________________________________   PHYSICAL EXAM:  VITAL SIGNS: ED Triage Vitals  Enc Vitals Group     BP 10/04/16 1207 (!) 167/99     Pulse Rate 10/04/16 1207 (!) 109     Resp 10/04/16 1207 18     Temp 10/04/16 1207 98.1 F (36.7 C)     Temp Source 10/04/16 1207 Oral     SpO2 10/04/16 1207 97 %     Weight 10/04/16 1208 230 lb 9.6 oz (104.6 kg)     Height 10/04/16 1208 6\' 2"  (1.88 m)     Head Circumference --      Peak Flow --      Pain Score --      Pain Loc --      Pain Edu? --      Excl. in [place]? --     Constitutional: Alert and oriented. Well appearing and in no acute distress. Eyes: Conjunctivae are normal. PERRL. EOMI. Head: Atraumatic. Nose: No congestion/rhinnorhea. Mouth/Throat: Mucous membranes are moist.   Neck: No stridor.   Cardiovascular: Tachycardic, regular rhythm. Grossly normal heart sounds.  Respiratory: Normal respiratory effort.  No retractions. Lungs CTAB. Gastrointestinal: Soft and nontender. No distention. No CVA tenderness. Musculoskeletal: No lower extremity tenderness nor edema.  No joint effusions. Neurologic:  Normal speech and language. No gross focal neurologic deficits are appreciated.  Skin:  Skin is warm, dry and intact. No rash noted. Psychiatric: Mood and affect are normal. Speech and behavior are normal.  ____________________________________________   LABS (all labs ordered are listed, but only abnormal results are displayed)  Labs Reviewed  COMPREHENSIVE METABOLIC PANEL - Abnormal; Notable for the following:       Result Value   Chloride 99  (*)    Glucose, Bld 118 (*)    BUN 43 (*)    Creatinine, Ser 4.62 (*)    Albumin 2.6 (*)    GFR calc non Af Amer 13 (*)    GFR calc Af Amer 15 (*)    All other components within normal limits  CBC - Abnormal; Notable for the following:    RBC 3.41 (*)    Hemoglobin 8.6 (*)    HCT 26.6 (*)    MCV 78.1 (*)    MCH 25.4 (*)    RDW 16.1 (*)    All other components within normal limits  LIPASE, BLOOD  TROPONIN I  URINALYSIS, COMPLETE (UACMP) WITH MICROSCOPIC   ____________________________________________  EKG  ED ECG REPORT I, Doran Stabler, the attending physician, personally viewed and interpreted this ECG.  Date: 10/04/2016  EKG Time: 1218  Rate: 111  Rhythm: sinus tachycardia  Axis: Normal axis  Intervals:none  ST&T Change: No ST segment elevation or depression. no abnormal T-wave inversion.  ____________________________________________  [id number]  DG Chest 1 View (Final result)  Result time 10/04/16 14:10:18  Final result by Rolm Baptise, MD (10/04/16 14:10:18)           Narrative:   CLINICAL DATA: Burning sensation in chest.  EXAM: CHEST 1 VIEW  COMPARISON: 06/12/2016  FINDINGS: Right dialysis catheter is in place with the tip at the cavoatrial junction. Heart and mediastinal contours are within normal limits. No focal opacities or effusions. No acute bony abnormality.  IMPRESSION: No active disease.   Electronically Signed By: Rolm Baptise M.D. On: 10/04/2016 14:10          ____________________________________________   PROCEDURES  Procedure(s) performed:   Procedures  Critical Care performed:   ____________________________________________   INITIAL IMPRESSION / ASSESSMENT AND PLAN / ED COURSE  Pertinent labs & imaging results that were available during my care of the patient were reviewed by me and considered in my medical decision making (see chart for details).    Clinical Course     ----------------------------------------- 3:10 PM on 10/04/2016 -----------------------------------------  After GI cocktail as well as Zofran the patient feels improved. His heart rate is still 1 04/29/2014 but he says that this is his baseline. Reassuring labs as well as imaging. Possible viral illness versus gastric irritation. Will be discharged home. He understands the plan and is willing to comply. Last episode of vomitus last night. Tolerating by mouth fluids at this time and says that he was able to eat earlier today. Denies any pain still.  ____________________________________________   FINAL CLINICAL IMPRESSION(S) / ED DIAGNOSES  Nausea and vomiting.    NEW MEDICATIONS STARTED DURING THIS VISIT:  New Prescriptions   No medications on file     Note:  This document was prepared using Dragon voice recognition software and may include unintentional dictation errors.    Orbie Pyo, MD 10/04/16 [phone]

## 2016-10-04 NOTE — ED Notes (Signed)
Spoke with Pamala Hurry from [facility], said that pt wold need EMS transport back to facility.  Gave report to [facility].

## 2016-10-04 NOTE — ED Triage Notes (Signed)
Pt arrived via EMS from [facility] for reports of vomiting that began yesterday. Pt reports emesis started as clear then turned to brown and then coffee grounds. Pt reports last vomited last night. EMS reports 171/100, VSS.

## 2017-04-11 ENCOUNTER — Emergency Department: Payer: Medicare Other

## 2017-04-11 ENCOUNTER — Encounter: Payer: Self-pay | Admitting: Emergency Medicine

## 2017-04-11 ENCOUNTER — Inpatient Hospital Stay
Admission: EM | Admit: 2017-04-11 | Discharge: 2017-04-13 | DRG: 638 | Disposition: A | Discharge status: Home / Self Care | Payer: Medicare Other | Attending: Internal Medicine | Admitting: Internal Medicine

## 2017-04-11 DIAGNOSIS — R531 Weakness: Secondary | ICD-10-CM | POA: Diagnosis present

## 2017-04-11 DIAGNOSIS — K920 Hematemesis: Secondary | ICD-10-CM | POA: Diagnosis present

## 2017-04-11 DIAGNOSIS — Z841 Family history of disorders of kidney and ureter: Secondary | ICD-10-CM

## 2017-04-11 DIAGNOSIS — E162 Hypoglycemia, unspecified: Secondary | ICD-10-CM

## 2017-04-11 DIAGNOSIS — E1122 Type 2 diabetes mellitus with diabetic chronic kidney disease: Secondary | ICD-10-CM | POA: Diagnosis present

## 2017-04-11 DIAGNOSIS — N184 Chronic kidney disease, stage 4 (severe): Secondary | ICD-10-CM | POA: Diagnosis present

## 2017-04-11 DIAGNOSIS — B09 Unspecified viral infection characterized by skin and mucous membrane lesions: Secondary | ICD-10-CM | POA: Diagnosis present

## 2017-04-11 DIAGNOSIS — Z79899 Other long term (current) drug therapy: Secondary | ICD-10-CM | POA: Diagnosis not present

## 2017-04-11 DIAGNOSIS — E785 Hyperlipidemia, unspecified: Secondary | ICD-10-CM | POA: Diagnosis present

## 2017-04-11 DIAGNOSIS — E118 Type 2 diabetes mellitus with unspecified complications: Secondary | ICD-10-CM | POA: Diagnosis present

## 2017-04-11 DIAGNOSIS — Z794 Long term (current) use of insulin: Secondary | ICD-10-CM

## 2017-04-11 DIAGNOSIS — Z7982 Long term (current) use of aspirin: Secondary | ICD-10-CM | POA: Diagnosis not present

## 2017-04-11 DIAGNOSIS — I129 Hypertensive chronic kidney disease with stage 1 through stage 4 chronic kidney disease, or unspecified chronic kidney disease: Secondary | ICD-10-CM | POA: Diagnosis present

## 2017-04-11 DIAGNOSIS — E11649 Type 2 diabetes mellitus with hypoglycemia without coma: Principal | ICD-10-CM | POA: Diagnosis present

## 2017-04-11 DIAGNOSIS — A419 Sepsis, unspecified organism: Secondary | ICD-10-CM | POA: Diagnosis present

## 2017-04-11 DIAGNOSIS — Z8249 Family history of ischemic heart disease and other diseases of the circulatory system: Secondary | ICD-10-CM | POA: Diagnosis not present

## 2017-04-11 DIAGNOSIS — Z8673 Personal history of transient ischemic attack (TIA), and cerebral infarction without residual deficits: Secondary | ICD-10-CM

## 2017-04-11 DIAGNOSIS — Z833 Family history of diabetes mellitus: Secondary | ICD-10-CM

## 2017-04-11 DIAGNOSIS — M109 Gout, unspecified: Secondary | ICD-10-CM | POA: Diagnosis present

## 2017-04-11 DIAGNOSIS — R000 Tachycardia, unspecified: Secondary | ICD-10-CM | POA: Diagnosis present

## 2017-04-11 DIAGNOSIS — I10 Essential (primary) hypertension: Secondary | ICD-10-CM | POA: Diagnosis present

## 2017-04-11 LAB — COMPREHENSIVE METABOLIC PANEL
ALK PHOS: 86 U/L (ref 38–126)
ALT: 11 U/L — AB (ref 17–63)
ANION GAP: 10 (ref 5–15)
AST: 20 U/L (ref 15–41)
Albumin: 4 g/dL (ref 3.5–5.0)
BUN: 72 mg/dL — ABNORMAL HIGH (ref 6–20)
CALCIUM: 9.3 mg/dL (ref 8.9–10.3)
CO2: 20 mmol/L — ABNORMAL LOW (ref 22–32)
CREATININE: 5.08 mg/dL — AB (ref 0.61–1.24)
Chloride: 108 mmol/L (ref 101–111)
GFR, EST AFRICAN AMERICAN: 13 mL/min — AB (ref >60)
GFR, EST NON AFRICAN AMERICAN: 11 mL/min — AB (ref >60)
Glucose, Bld: 91 mg/dL (ref 65–99)
Potassium: 4.6 mmol/L (ref 3.5–5.1)
SODIUM: 138 mmol/L (ref 135–145)
Total Bilirubin: 1 mg/dL (ref 0.3–1.2)
Total Protein: 7.6 g/dL (ref 6.5–8.1)

## 2017-04-11 LAB — URINALYSIS, ROUTINE W REFLEX MICROSCOPIC
BILIRUBIN URINE: NEGATIVE
Glucose, UA: NEGATIVE mg/dL
Ketones, ur: NEGATIVE mg/dL
Nitrite: NEGATIVE
Protein, ur: 100 mg/dL — AB
SPECIFIC GRAVITY, URINE: 1.013 (ref 1.005–1.030)
pH: 5 (ref 5.0–8.0)

## 2017-04-11 LAB — CBC WITH DIFFERENTIAL/PLATELET
BLASTS: 0 %
Band Neutrophils: 1 %
Basophils Absolute: 0 10*3/uL (ref 0–0.1)
Basophils Relative: 0 %
Eosinophils Absolute: 0.3 10*3/uL (ref 0–0.7)
Eosinophils Relative: 1 %
HEMATOCRIT: 40.1 % (ref 40.0–52.0)
Hemoglobin: 13.1 g/dL (ref 13.0–18.0)
LYMPHS PCT: 12 %
Lymphs Abs: 3.2 10*3/uL (ref 1.0–3.6)
MCH: 25.6 pg — AB (ref 26.0–34.0)
MCHC: 32.7 g/dL (ref 32.0–36.0)
MCV: 78.3 fL — AB (ref 80.0–100.0)
MYELOCYTES: 0 %
Metamyelocytes Relative: 0 %
Monocytes Absolute: 1.9 10*3/uL — ABNORMAL HIGH (ref 0.2–1.0)
Monocytes Relative: 7 %
NRBC: 0 /100{WBCs}
Neutro Abs: 21.2 10*3/uL — ABNORMAL HIGH (ref 1.4–6.5)
Neutrophils Relative %: 79 %
OTHER: 0 %
PROMYELOCYTES ABS: 0 %
Platelets: 194 10*3/uL (ref 150–440)
RBC: 5.13 MIL/uL (ref 4.40–5.90)
RDW: 16.3 % — ABNORMAL HIGH (ref 11.5–14.5)
WBC: 26.6 10*3/uL — AB (ref 3.8–10.6)

## 2017-04-11 LAB — HEMOGLOBIN AND HEMATOCRIT, BLOOD
HEMATOCRIT: 38 % — AB (ref 40.0–52.0)
HEMOGLOBIN: 12.1 g/dL — AB (ref 13.0–18.0)

## 2017-04-11 LAB — LACTIC ACID, PLASMA: LACTIC ACID, VENOUS: 1.5 mmol/L (ref 0.5–1.9)

## 2017-04-11 LAB — TROPONIN I

## 2017-04-11 MED ORDER — PIPERACILLIN-TAZOBACTAM 3.375 G IVPB
3.3750 g | Freq: Three times a day (TID) | INTRAVENOUS | Status: DC
  Administered 2017-04-12 – 2017-04-13 (×4): 3.375 g via INTRAVENOUS
  Filled 2017-04-11 (×7): qty 50

## 2017-04-11 MED ORDER — SODIUM CHLORIDE 0.9 % IV BOLUS (SEPSIS)
1000.0000 mL | Freq: Once | INTRAVENOUS | Status: AC
  Administered 2017-04-11: 1000 mL via INTRAVENOUS

## 2017-04-11 MED ORDER — INSULIN ASPART 100 UNIT/ML ~~LOC~~ SOLN: 0.0000 [IU] | Freq: Four times a day (QID) | SUBCUTANEOUS | Status: DC

## 2017-04-11 MED ORDER — SODIUM CHLORIDE 0.9 % IV SOLN
8.0000 mg/h | INTRAVENOUS | Status: DC
  Filled 2017-04-11: qty 80

## 2017-04-11 MED ORDER — ACETAMINOPHEN 650 MG RE SUPP: 650.0000 mg | Freq: Four times a day (QID) | RECTAL | Status: DC | PRN

## 2017-04-11 MED ORDER — PANTOPRAZOLE SODIUM 40 MG IV SOLR: 40.0000 mg | Freq: Two times a day (BID) | INTRAVENOUS | Status: DC

## 2017-04-11 MED ORDER — ACETAMINOPHEN 325 MG PO TABS: 650.0000 mg | ORAL_TABLET | Freq: Once | ORAL | Status: DC

## 2017-04-11 MED ORDER — SODIUM CHLORIDE 0.9 % IV SOLN
80.0000 mg | Freq: Once | INTRAVENOUS | Status: DC
  Filled 2017-04-11: qty 80

## 2017-04-11 MED ORDER — VANCOMYCIN HCL 10 G IV SOLR: 2000.0000 mg | Freq: Once | INTRAVENOUS | Status: DC

## 2017-04-11 MED ORDER — HEPARIN SODIUM (PORCINE) 5000 UNIT/ML IJ SOLN
5000.0000 [IU] | Freq: Three times a day (TID) | INTRAMUSCULAR | Status: DC
  Administered 2017-04-11 – 2017-04-13 (×5): 5000 [IU] via SUBCUTANEOUS
  Filled 2017-04-11 (×4): qty 1

## 2017-04-11 MED ORDER — ASPIRIN EC 81 MG PO TBEC
81.0000 mg | DELAYED_RELEASE_TABLET | Freq: Every day | ORAL | Status: DC
  Administered 2017-04-12 – 2017-04-13 (×2): 81 mg via ORAL
  Filled 2017-04-11 (×2): qty 1

## 2017-04-11 MED ORDER — PIPERACILLIN-TAZOBACTAM 3.375 G IVPB 30 MIN
3.3750 g | Freq: Once | INTRAVENOUS | Status: AC
  Administered 2017-04-11: 3.375 g via INTRAVENOUS

## 2017-04-11 MED ORDER — ATORVASTATIN CALCIUM 20 MG PO TABS
80.0000 mg | ORAL_TABLET | Freq: Every day | ORAL | Status: DC
  Administered 2017-04-12 – 2017-04-13 (×2): 80 mg via ORAL
  Filled 2017-04-11 (×2): qty 4

## 2017-04-11 MED ORDER — SEVELAMER CARBONATE 800 MG PO TABS
800.0000 mg | ORAL_TABLET | Freq: Three times a day (TID) | ORAL | Status: DC
  Administered 2017-04-11 – 2017-04-13 (×5): 800 mg via ORAL
  Filled 2017-04-11 (×7): qty 1

## 2017-04-11 MED ORDER — ACETAMINOPHEN 325 MG PO TABS: 650.0000 mg | ORAL_TABLET | Freq: Four times a day (QID) | ORAL | Status: DC | PRN

## 2017-04-11 MED ORDER — PANTOPRAZOLE SODIUM 40 MG IV SOLR
40.0000 mg | Freq: Two times a day (BID) | INTRAVENOUS | Status: DC
  Administered 2017-04-11 – 2017-04-12 (×2): 40 mg via INTRAVENOUS
  Filled 2017-04-11: qty 40

## 2017-04-11 MED ORDER — VANCOMYCIN HCL 10 G IV SOLR
2000.0000 mg | Freq: Once | INTRAVENOUS | Status: DC
  Filled 2017-04-11 (×2): qty 2000

## 2017-04-11 MED ORDER — LEVETIRACETAM 500 MG PO TABS
500.0000 mg | ORAL_TABLET | ORAL | Status: DC
  Administered 2017-04-13: 500 mg via ORAL
  Filled 2017-04-11: qty 1

## 2017-04-11 MED ORDER — ONDANSETRON HCL 4 MG PO TABS: 4.0000 mg | ORAL_TABLET | Freq: Four times a day (QID) | ORAL | Status: DC | PRN

## 2017-04-11 MED ORDER — SODIUM CHLORIDE 0.9 % IV BOLUS (SEPSIS): 1000.0000 mL | Freq: Once | INTRAVENOUS | Status: DC

## 2017-04-11 MED ORDER — ONDANSETRON HCL 4 MG/2ML IJ SOLN: 4.0000 mg | Freq: Four times a day (QID) | INTRAMUSCULAR | Status: DC | PRN

## 2017-04-11 NOTE — H&P (Signed)
[facility] NAME: Caleb Hughes    MR#:  [id number]  DATE OF BIRTH:  04/11/1961  DATE OF ADMISSION:  04/11/2017  PRIMARY CARE PHYSICIAN: [facility]   REQUESTING/REFERRING PHYSICIAN: Mariea Clonts, MD  CHIEF COMPLAINT:   Chief Complaint  Patient presents with  . Weakness    HISTORY OF PRESENT ILLNESS:  Caleb Hughes  is a 56 y.o. male who presents with Malaise, "sweats", urinary hesitancy. Patient told his son today he wasn't feeling well, he felt like his blood pressure was high and his glucose level was low. He had the above stated symptoms, and was brought to the ED for evaluation. Here he was found to be tachycardic, with an elevated white blood cell count. His blood glucose level was okay, but his blood pressure was borderline low. Patient has chronic kidney disease, stage IV borderline stage V, but is not on dialysis. Evaluation here shows no evidence of pneumonia, blood cultures were sent, urine studies pending, sepsis protocol followed and hospitalist called for admission.  PAST MEDICAL HISTORY:   Past Medical History:  Diagnosis Date  . Diabetes mellitus without complication ([place])   . Gout   . Hyperlipemia   . Hypertension   . Morbid obesity ([place])     PAST SURGICAL HISTORY:   Past Surgical History:  Procedure Laterality Date  . NO PAST SURGERIES      SOCIAL HISTORY:   Social History  Substance Use Topics  . Smoking status: Never Smoker  . Smokeless tobacco: Never Used  . Alcohol use No    FAMILY HISTORY:   Family History  Problem Relation Age of Onset  . Hypertension Mother   . Diabetes type II Mother   . CAD Father   . Hypertension Father   . Alcohol abuse Father   . Alcohol abuse Brother   . Alcohol abuse Brother   . Kidney failure Brother   . Kidney failure Brother     DRUG ALLERGIES:  No Known Allergies  MEDICATIONS AT HOME:   Prior to Admission medications   Medication  Sig Start Date End Date Taking? Authorizing Provider  amLODipine (NORVASC) 10 MG tablet Take 10 mg by mouth daily.   Yes [provider]  aspirin EC 81 MG tablet Take 81 mg by mouth daily.   Yes [provider]  atorvastatin (LIPITOR) 80 MG tablet Take 80 mg by mouth daily.   Yes [provider]  carvedilol (COREG) 12.5 MG tablet Take 1 tablet by mouth 2 (two) times daily. 03/12/17  Yes [provider]  hydrALAZINE (APRESOLINE) 25 MG tablet Take 25 mg by mouth 3 (three) times daily. 01/12/17  Yes [provider]  insulin NPH Human (HUMULIN N,NOVOLIN N) 100 UNIT/ML injection Inject 3 Units into the skin 2 (two) times daily with a meal. 09/23/16  Yes [provider]  levETIRAcetam (KEPPRA) 500 MG tablet Take 500 mg by mouth 3 (three) times a week. Tues,thurs,saturday   Yes [provider]  Melatonin 3 MG TABS Take 1 tablet by mouth daily.   Yes [provider]  pantoprazole (PROTONIX) 40 MG tablet Take 40 mg by mouth daily.   Yes [provider]  polyethylene glycol (MIRALAX / GLYCOLAX) packet Take 17 g by mouth daily.   Yes [provider]  RENVELA 800 MG tablet Take 1 tablet by mouth 3 (three) times daily. 01/15/17  Yes [provider]  senna (SENOKOT) 8.6 MG TABS  tablet Take 1 tablet by mouth at bedtime.   Yes [provider]  simvastatin (ZOCOR) 20 MG tablet Take 1 tablet by mouth daily. 02/06/17  Yes [provider]  Vitamin D, Ergocalciferol, (DRISDOL) 50000 units CAPS capsule Take 1 capsule by mouth once a week. 09/15/16 10/27/17 Yes [provider]  baclofen (LIORESAL) 10 MG tablet Take 1 tablet (10 mg total) by mouth 3 (three) times daily. Patient not taking: Reported on 10/04/2016 07/04/16   Arlyss Repress, PA-C  cloNIDine (CATAPRES) 0.1 MG tablet Take 1 tablet (0.1 mg total) by mouth 2 (two) times daily. Patient not taking: Reported on 10/04/2016 06/15/16   Henreitta Leber, MD  famotidine (PEPCID) 20 MG tablet Take 20 mg by mouth daily. 03/12/17   [provider]  NOVOLOG MIX 70/30 (70-30) 100 UNIT/ML injection Inject 0.08 mLs (8 Units total) into the skin 2 (two) times daily with a meal. Patient not taking: Reported on 10/04/2016 03/16/16   Loletha Grayer, MD  ondansetron (ZOFRAN) 4 MG tablet Take 1 tablet (4 mg total) by mouth daily as needed. 10/04/16   Schaevitz, Randall An, MD  traMADol (ULTRAM) 50 MG tablet Take 1 tablet (50 mg total) by mouth every 6 (six) hours as needed. Patient not taking: Reported on 10/04/2016 07/04/16 07/04/17  Arlyss Repress, PA-C    REVIEW OF SYSTEMS:  Review of Systems  Constitutional: Positive for fever ("sweats") and malaise/fatigue. Negative for chills and weight loss.  HENT: Negative for ear pain, hearing loss and tinnitus.   Eyes: Negative for blurred vision, double vision, pain and redness.  Respiratory: Negative for cough, hemoptysis and shortness of breath.   Cardiovascular: Negative for chest pain, palpitations, orthopnea and leg swelling.  Gastrointestinal: Negative for abdominal pain, constipation, diarrhea, nausea and vomiting.  Genitourinary: Negative for dysuria, frequency and hematuria.       Urinary hesitancy  Musculoskeletal: Negative for back pain, joint pain and neck pain.  Skin:       No acne, rash, or lesions  Neurological: Negative for dizziness, tremors, focal weakness and weakness.  Endo/Heme/Allergies: Negative for polydipsia. Does not bruise/bleed easily.  Psychiatric/Behavioral: Negative for depression. The patient is not nervous/anxious and does not have insomnia.      VITAL SIGNS:   Vitals:   04/11/17 1738 04/11/17 1754 04/11/17 2000 04/11/17 2030  BP:   99/71 100/85  Pulse:   (!) 116   Resp:   19   Temp:      TempSrc:      SpO2:   95%   Weight:  104.6 kg (230 lb 9.6 oz)    Height: 6\' 2"  (1.88 m)      Wt Readings from Last 3 Encounters:  04/11/17 104.6 kg (230 lb 9.6 oz)   10/04/16 104.6 kg (230 lb 9.6 oz)  07/08/16 122 kg (269 lb)    PHYSICAL EXAMINATION:  Physical Exam  Vitals reviewed. Constitutional: He is oriented to person, place, and time. He appears well-developed and well-nourished. No distress.  HENT:  Head: Normocephalic and atraumatic.  Dry mucous membranes  Eyes: Conjunctivae and EOM are normal. Pupils are equal, round, and reactive to light. No scleral icterus.  Neck: Normal range of motion. Neck supple. No JVD present. No thyromegaly present.  Cardiovascular: Regular rhythm and intact distal pulses.  Exam reveals no gallop and no friction rub.   No murmur heard. tachycardic  Respiratory: Effort normal and breath sounds normal. No respiratory distress. He has no wheezes. He has  no rales.  GI: Soft. Bowel sounds are normal. He exhibits no distension. There is no tenderness.  Musculoskeletal: Normal range of motion. He exhibits no edema.  No arthritis, no gout  Lymphadenopathy:    He has no cervical adenopathy.  Neurological: He is alert and oriented to person, place, and time. No cranial nerve deficit.  No dysarthria, no aphasia  Skin: Skin is warm and dry. No rash noted. No erythema.  Psychiatric: He has a normal mood and affect. His behavior is normal. Judgment and thought content normal.    LABORATORY PANEL:   CBC  Recent Labs Lab 04/11/17 1749  WBC 26.6*  HGB 13.1  HCT 40.1  PLT 194   ------------------------------------------------------------------------------------------------------------------  Chemistries   Recent Labs Lab 04/11/17 1749  NA 138  K 4.6  CL 108  CO2 20*  GLUCOSE 91  BUN 72*  CREATININE 5.08*  CALCIUM 9.3  AST 20  ALT 11*  ALKPHOS 86  BILITOT 1.0   ------------------------------------------------------------------------------------------------------------------  Cardiac Enzymes  Recent Labs Lab 04/11/17 1749  TROPONINI <0.03    ------------------------------------------------------------------------------------------------------------------  RADIOLOGY:  Dg Chest 2 View  Result Date: 04/11/2017 CLINICAL DATA:  Fever and hypoglycemia EXAM: CHEST  2 VIEW COMPARISON:  10/04/2016 FINDINGS: The heart size and mediastinal contours are within normal limits. Both lungs are clear. No acute nor suspicious osseous abnormalities. Degenerate change along the dorsal spine. IMPRESSION: No active cardiopulmonary disease. Electronically Signed   By: Ashley Royalty M.D.   On: 04/11/2017 18:36   Ct Head Wo Contrast  Result Date: 04/11/2017 CLINICAL DATA:  Altered mental status EXAM: CT HEAD WITHOUT CONTRAST TECHNIQUE: Contiguous axial images were obtained from the base of the skull through the vertex without intravenous contrast. COMPARISON:  07/08/2016 FINDINGS: Brain: No acute territorial infarction, hemorrhage or intracranial mass is seen. Old left parietal infarct. Mild atrophy. Stable ventricle size. Vascular: No hyperdense vessels.  No unexpected calcifications. Skull: No fracture or suspicious bone lesion. Sinuses/Orbits: Mild mucosal thickening in the ethmoid sinuses. No acute orbital abnormality. Other: None IMPRESSION: No definite CT evidence for acute intracranial abnormality. Electronically Signed   By: Donavan Foil M.D.   On: 04/11/2017 20:05    EKG:   Orders placed or performed during the hospital encounter of 04/11/17  . ED EKG 12-Lead  . ED EKG 12-Lead  . EKG 12-Lead  . EKG 12-Lead    IMPRESSION AND PLAN:  Principal Problem:   Sepsis ([place]) - unclear source of infection at this time, though strong suspicion for urinary source. Urine studies pending. Patient's blood pressure was borderline low, but his lactic acid was within normal limits. IV antibiotics were started and will be continued on admission. Cultures were sent from the ED. Active Problems:   Type II diabetes mellitus with manifestations (HCC) - sliding scale  insulin with corresponding glucose checks   Hypertension - patient's blood pressure is borderline low, holding home antihypertensives for now   CKD (chronic kidney disease), stage IV (HCC) - continue home meds for this, monitor closely, creatinine is up some from his prior value, though his GFR is about the same. Might consider nephrology consult if his renal function declines anymore.   Hyperlipemia - continue home dose statin  All the records are reviewed and case discussed with ED provider. Management plans discussed with the patient and/or family.  DVT PROPHYLAXIS: SubQ heparin  GI PROPHYLAXIS: PPI  ADMISSION STATUS: Inpatient  ODE STATUS: Full Code Status History    Date Active Date Inactive Code Status  Order ID Comments User Context   06/12/2016 11:55 PM 06/15/2016  4:04 PM Full Code [id number]  Harvie Bridge, DO Inpatient   03/13/2016  3:12 AM 03/16/2016  2:11 PM Full Code [id number]  Idelle Crouch, MD Inpatient      TOTAL TIME TAKING CARE OF THIS PATIENT: 45 minutes.   Malisha Mabey [place] 04/11/2017, 8:53 PM  [facility]  Office  [phone]  CC: Primary care physician; [facility]  Note:  This document was prepared using Dragon voice recognition software and may include unintentional dictation errors.

## 2017-04-11 NOTE — ED Notes (Signed)
Patient unable to urinate at this time. Patient does not want [place] to cath him. Gave urinal for patient to give sample.

## 2017-04-11 NOTE — ED Triage Notes (Signed)
Patient comes in from home via [facility] with not feeling well. Patient states he urinated on himself which has never happened before. Patient states he knew he had to go but couldn't make it to the bathroom in time. Patient also having shortness of breath with laying down at night. Per EMS per the family patient was confused, but EMS reports he was alert and oriented x4. Patient told EDP he called EMS because he blood sugar was low. CBG 74. 109/83, sinus tach on the monitor. Patient is diabetic.

## 2017-04-11 NOTE — ED Notes (Signed)
Patient had an episode of vomiting. Vomit did look like it had some blood in it. EDP notified. Occulted the emesis which was positive for blood.

## 2017-04-11 NOTE — ED Notes (Signed)
Pt transported to xray 

## 2017-04-11 NOTE — ED Notes (Signed)
Patient returned from x-ray. Hooked back up to monitor.

## 2017-04-11 NOTE — ED Notes (Signed)
Patient laying on his left side blood pressure was 70/41. Had patient lay on his back and repeat blood pressure 101/58. Laying down.

## 2017-04-11 NOTE — ED Provider Notes (Signed)
[facility] Emergency Department Provider Note  ____________________________________________  Time seen: Approximately 6:09 PM  I have reviewed the triage vital signs and the nursing notes.   HISTORY  Chief Complaint Weakness  The patient is a poor historian.  HPI Caleb Hughes is a 56 y.o. male with a history of CVA, DM, HTN, presenting for"not feeling well." The patient states that he was sitting at home when he felt like he had to urinate but was unable to get up to go do so, resulting in urination on himself. He also states "I'm short of breath and not really short of breath." He is unable to describe what he means by this, and initially says that he "sometimes" shortness of breath and then denies this. He then denies having orthopnea, but then states "I'm short of breath when I lay down." Patient denies any chest pain, cough or cold symptoms. He states that he was feeling "sweaty" today and called the paramedics; he was found to have a blood sugar of 74, but has not taken his insulin in "months."   Past Medical History:  Diagnosis Date  . Diabetes mellitus without complication ([place])   . Gout   . Hyperlipemia   . Hypertension   . Morbid obesity [facility])     Patient Active Problem List   Diagnosis Date Noted  . AKI (acute kidney injury) ([place]) 06/12/2016  . 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 Elevated troponin 03/13/2016  . Hyperkalemia 03/13/2016  . Morbid obesity ([place]) 03/13/2016  . Hypertension   . Hyperlipemia   . Gout   . ARF (acute renal failure) ([place]) 03/12/2016  . Dehydration 03/12/2016  . Intractable nausea and vomiting 03/12/2016  . Type II diabetes mellitus with manifestations ([place]) 03/12/2016    History reviewed. No pertinent surgical history.  Current Outpatient Rx  . Order #: [id number] Class: Historical Med  . Order #: [id number] Class: Historical Med  . Order #: [id number] Class: Historical Med  . Order  #: [id number] Class: Historical Med  . Order #: [id number] Class: Historical Med  . Order #: [id number] Class: Print  . Order #: [id number] Class: Historical Med  . Order #: [id number] Class: Print  . Order #: [id number] Class: Historical Med  . Order #: [id number] Class: Historical Med  . Order #: [id number] Class: Historical Med  . Order #: [id number] Class: Historical Med  . Order #: [id number] Class: No Print  . Order #: [id number] Class: Print  . Order #: [id number] Class: Historical Med  . Order #: [id number] Class: Historical Med  . Order #: [id number] Class: Historical Med  . Order #: [id number] Class: Historical Med  . Order #: [id number] Class: Historical Med  . Order #: [id number] Class: Historical Med  . Order #: [id number] Class: Print    Allergies Patient has no known allergies.  Family History  Problem Relation Age of Onset  . Hypertension Mother   . Diabetes type II Mother   . CAD Father   . Hypertension Father   . Alcohol abuse Father   . Alcohol abuse Brother   . Alcohol abuse Brother   . Kidney failure Brother   . Kidney failure Brother     Social History Social History  Substance Use Topics  . Smoking status: Never Smoker  . Smokeless tobacco: Never Used  . Alcohol use No    Review of Systems Constitutional: No fever/chills.Positive diaphoresis. No lightheadedness or syncope. Eyes: No visual changes. No blurred or double vision. ENT: No sore throat. No congestion or rhinorrhea. Cardiovascular: Denies chest pain. Denies palpitations. Respiratory: Possible shortness  of breath.  No cough. Gastrointestinal: No abdominal pain.  No nausea, no vomiting.  No diarrhea.  No constipation. Genitourinary: Negative for dysuria. Musculoskeletal: Negative for back pain. Skin: Negative for rash. Neurological: Negative for headaches. No new focal numbness, tingling or weakness. Patient has residual right lower extremity weakness from prior CVA. Endocrine:Positive  hypoglycemia.    ____________________________________________   PHYSICAL EXAM:  VITAL SIGNS: ED Triage Vitals  Enc Vitals Group     BP 04/11/17 1737 105/63     Pulse Rate 04/11/17 1737 (!) 126     Resp 04/11/17 1737 (!) 21     Temp 04/11/17 1737 (!) 100.5 F (38.1 C)     Temp Source 04/11/17 1737 Oral     SpO2 04/11/17 1737 96 %     Weight 04/11/17 1754 230 lb 9.6 oz (104.6 kg)     Height 04/11/17 1738 6\' 2"  (1.88 m)     Head Circumference --      Peak Flow --      Pain Score --      Pain Loc --      Pain Edu? --      Excl. in [place]? --     Constitutional: Alert and oriented x 3 but does not answer questions reliably or appropriately at times. Chronically ill appearing and in no acute distress.  Eyes: Conjunctivae are normal.  EOMI. No scleral icterus. No eye discharge. Head: Atraumatic. Nose: No congestion/rhinnorhea. Mouth/Throat: Mucous membranes are moist.  Neck: No stridor.  Supple.  No JVD. No meningismus. Cardiovascular: Fast rate, regular rhythm. No murmurs, rubs or gallops.  Respiratory: Normal respiratory effort.  No accessory muscle use or retractions. Lungs CTAB.  No wheezes, rales or ronchi. Gastrointestinal: Overweight. Soft, nontender and nondistended.  No guarding or rebound.  No peritoneal signs. Musculoskeletal: No LE edema. No ttp in the calves or palpable cords.  Negative Homan's sign. Neurologic:  A&Ox3.  Speech is clear.  Face and smile are symmetric.  EOMI.  Moves all extremities well, except the right lower extremity which is weaker Skin:  Skin is warm, dry and intact. No rash noted. Psychiatric: Mood and affect are normal.   ____________________________________________   LABS (all labs ordered are listed, but only abnormal results are displayed)  Labs Reviewed  COMPREHENSIVE METABOLIC PANEL - Abnormal; Notable for the following:       Result Value   CO2 20 (*)    BUN 72 (*)    Creatinine, Ser 5.08 (*)    ALT 11 (*)    GFR calc non Af Amer  11 (*)    GFR calc Af Amer 13 (*)    All other components within normal limits  CBC WITH DIFFERENTIAL/PLATELET - Abnormal; Notable for the following:    WBC 26.6 (*)    MCV 78.3 (*)    MCH 25.6 (*)    RDW 16.3 (*)    Neutro Abs 21.2 (*)    Monocytes Absolute 1.9 (*)    All other components within normal limits  CULTURE, BLOOD (ROUTINE X 2)  CULTURE, BLOOD (ROUTINE X 2)  URINE CULTURE  LACTIC ACID, PLASMA  TROPONIN I  URINALYSIS, ROUTINE W REFLEX MICROSCOPIC  LACTIC ACID, PLASMA  BLOOD GAS, VENOUS  HEMOGLOBIN AND HEMATOCRIT, BLOOD   ____________________________________________  EKG  ED ECG REPORT I, Eula Listen, the attending physician, personally viewed and interpreted this ECG.   Date: 04/11/2017  EKG Time: 1750  Rate: 118  Rhythm: sinus tachycardia  Axis: normal  Intervals:none  ST&T Change: No STEMI  ____________________________________________  RADIOLOGY  Dg Chest 2 View  Result Date: 04/11/2017 CLINICAL DATA:  Fever and hypoglycemia EXAM: CHEST  2 VIEW COMPARISON:  10/04/2016 FINDINGS: The heart size and mediastinal contours are within normal limits. Both lungs are clear. No acute nor suspicious osseous abnormalities. Degenerate change along the dorsal spine. IMPRESSION: No active cardiopulmonary disease. Electronically Signed   By: Ashley Royalty M.D.   On: 04/11/2017 18:36   Ct Head Wo Contrast  Result Date: 04/11/2017 CLINICAL DATA:  Altered mental status EXAM: CT HEAD WITHOUT CONTRAST TECHNIQUE: Contiguous axial images were obtained from the base of the skull through the vertex without intravenous contrast. COMPARISON:  07/08/2016 FINDINGS: Brain: No acute territorial infarction, hemorrhage or intracranial mass is seen. Old left parietal infarct. Mild atrophy. Stable ventricle size. Vascular: No hyperdense vessels.  No unexpected calcifications. Skull: No fracture or suspicious bone lesion. Sinuses/Orbits: Mild mucosal thickening in the ethmoid sinuses.  No acute orbital abnormality. Other: None IMPRESSION: No definite CT evidence for acute intracranial abnormality. Electronically Signed   By: Donavan Foil M.D.   On: 04/11/2017 20:05    ____________________________________________   PROCEDURES  Procedure(s) performed: None  Procedures  Critical Care performed: No ____________________________________________   INITIAL IMPRESSION / ASSESSMENT AND PLAN / ED COURSE  Pertinent labs & imaging results that were available during my care of the patient were reviewed by me and considered in my medical decision making (see chart for details).  56 y.o. male with a history of diabetes, hypertension, CVA presenting with hypoglycemia, diaphoresis, and possible shortness of breath. The patient's clinical history is very difficult to obtain. However, he does have fever and tachycardia here, so we'll treat him empirically for sepsis and look for source. Urinalysis and chest x-ray are pending. We also get additional laboratory studies. Vancomycin and Zosyn have been ordered, as well as intravenous fluids. Plan admission.  8:31 PM  Talked with pt's son who reports this patient was acting similarly in the setting of [place].  CT head ordered.  Eitan Doubleday, [place], [place]: [phone]  ----------------------------------------- 8:29 PM on 04/11/2017 -----------------------------------------  The patient CT head did not show any acute process. I was called to the room because the patient had an episode of vomiting, which may be that he in nature. He has not had any epigastric pain, and his hematocrit is normal, but we will order Protonix bolus and drip. Looking at his CBC, he may have significant hemoconcentration, possibly from dehydration, and I'll repeat this as all of his values are so different than his baseline. The patient has been asked multiple times to give a urine specimen, and has refused catheterization. 30 received his antibiotics, and I'll plan to  admit him at this time. ____________________________________________  FINAL CLINICAL IMPRESSION(S) / ED DIAGNOSES  Final diagnoses:  Hypoglycemia  Hematemesis with nausea  Sepsis, due to unspecified organism [facility])         NEW MEDICATIONS STARTED DURING THIS VISIT:  New Prescriptions   No medications on file      Eula Listen, MD 04/11/17 2031

## 2017-04-11 NOTE — ED Notes (Signed)
Patients son called Lorre Nick. Patient gave verbal ok to speak to him regarding his care.

## 2017-04-12 LAB — GLUCOSE, CAPILLARY
Glucose-Capillary: 100 mg/dL — ABNORMAL HIGH (ref 65–99)
Glucose-Capillary: 101 mg/dL — ABNORMAL HIGH (ref 65–99)
Glucose-Capillary: 123 mg/dL — ABNORMAL HIGH (ref 65–99)
Glucose-Capillary: 127 mg/dL — ABNORMAL HIGH (ref 65–99)
Glucose-Capillary: 87 mg/dL (ref 65–99)
Glucose-Capillary: 93 mg/dL (ref 65–99)
Glucose-Capillary: 98 mg/dL (ref 65–99)

## 2017-04-12 LAB — CBC
HEMATOCRIT: 35.9 % — AB (ref 40.0–52.0)
HEMOGLOBIN: 11.6 g/dL — AB (ref 13.0–18.0)
MCH: 25.8 pg — AB (ref 26.0–34.0)
MCHC: 32.3 g/dL (ref 32.0–36.0)
MCV: 80 fL (ref 80.0–100.0)
Platelets: 152 10*3/uL (ref 150–440)
RBC: 4.48 MIL/uL (ref 4.40–5.90)
RDW: 16.5 % — ABNORMAL HIGH (ref 11.5–14.5)
WBC: 26.5 10*3/uL — ABNORMAL HIGH (ref 3.8–10.6)

## 2017-04-12 LAB — BASIC METABOLIC PANEL
ANION GAP: 8 (ref 5–15)
BUN: 71 mg/dL — ABNORMAL HIGH (ref 6–20)
CO2: 19 mmol/L — ABNORMAL LOW (ref 22–32)
Calcium: 8.6 mg/dL — ABNORMAL LOW (ref 8.9–10.3)
Chloride: 114 mmol/L — ABNORMAL HIGH (ref 101–111)
Creatinine, Ser: 4.83 mg/dL — ABNORMAL HIGH (ref 0.61–1.24)
GFR, EST AFRICAN AMERICAN: 14 mL/min — AB (ref >60)
GFR, EST NON AFRICAN AMERICAN: 12 mL/min — AB (ref >60)
GLUCOSE: 102 mg/dL — AB (ref 65–99)
POTASSIUM: 4.4 mmol/L (ref 3.5–5.1)
Sodium: 141 mmol/L (ref 135–145)

## 2017-04-12 MED ORDER — VANCOMYCIN HCL 10 G IV SOLR
1250.0000 mg | INTRAVENOUS | Status: DC
  Administered 2017-04-13: 1250 mg via INTRAVENOUS
  Filled 2017-04-12: qty 1250

## 2017-04-12 MED ORDER — PANTOPRAZOLE SODIUM 40 MG PO TBEC
40.0000 mg | DELAYED_RELEASE_TABLET | Freq: Two times a day (BID) | ORAL | Status: DC
  Administered 2017-04-12 – 2017-04-13 (×2): 40 mg via ORAL
  Filled 2017-04-12 (×2): qty 1

## 2017-04-12 MED ORDER — INSULIN ASPART 100 UNIT/ML ~~LOC~~ SOLN
0.0000 [IU] | Freq: Three times a day (TID) | SUBCUTANEOUS | Status: DC
  Administered 2017-04-12: 22:00:00 1 [IU] via SUBCUTANEOUS
  Filled 2017-04-12: qty 1

## 2017-04-12 NOTE — Care Management (Signed)
Admitted to [facility] with the diagnosis of sepsis. Son, Dawaun, lives in the home. Sister is Abertine [phone]). Last seen Dr, Guy Sandifer 01/23/17, next appointment is in September. Cane, rolling walker, and wheelchair in the home. Prescriptions are filled at [facility] in [place]. [facility] in the past. [facility] in the past. Discharged from [facility] January 2018. No home oxygen. No falls. Good appetite. Family will transport. Physical therapy evaluation completed. No follow-up needs. Shelbie Ammons RN MSN CCM Care Management [phone]

## 2017-04-12 NOTE — Progress Notes (Signed)
Inpatient Diabetes Program Recommendations  AACE/ADA: New Consensus Statement on Inpatient Glycemic Control (2015)  Target Ranges:  Prepandial:   less than 140 mg/dL      Peak postprandial:   less than 180 mg/dL (1-2 hours)      Critically ill patients:  140 - 180 mg/dL   Lab Results  Component Value Date   GLUCAP 101 (H) 04/12/2017   HGBA1C 8.5 (H) 03/14/2016    Review of Glycemic Control:  Results for Caleb Hughes, Caleb Hughes (MRN [id number]) as of 04/12/2017 15:24  Ref. Range 04/12/2017 05:59 04/12/2017 07:30 04/12/2017 11:38 04/12/2017 13:01  Glucose-Capillary Latest Ref Range: 65 - 99 mg/dL 100 (H) 98 127 (H) 101 (H)   Diabetes history: Type 2 diabetes Outpatient Diabetes medications: NPH 3 units bid (Note per medication reconciliation, 70/30 stopped) Current orders for Inpatient glycemic control:  Novolog sensitive tid with meals  Inpatient Diabetes Program Recommendations:    Agree with current orders. A1C pending.  Does not appear to need basal insulin at this time. Will follow.  Thanks, Adah Perl, RN, BC-ADM Inpatient Diabetes Coordinator Pager [phone] (8a-5p)

## 2017-04-12 NOTE — Progress Notes (Signed)
Pharmacy Antibiotic Note  Caleb Hughes is a 56 y.o. male admitted on 04/11/2017 with sepsis.  Pharmacy has been consulted for vancomycin and Zosyn dosing.  Plan: DW 91kg  Vd 64L kei 0.022 hr-1  T1/2 32 hours Vancomycin 1250 mg q 36 hours ordered with stacked dosing. Level before 4th dose. Goal trough 15-20.  Zosyn 3.375 grams q 8 hours ordered.  Height: 6\' 2"  (188 cm) Weight: 230 lb 9.6 oz (104.6 kg) IBW/kg (Calculated) : 82.2  Temp (24hrs), Avg:99.4 F (37.4 C), Min:98.3 F (36.8 C), Max:100.5 F (38.1 C)   Recent Labs Lab 04/11/17 1749 04/12/17 0317  WBC 26.6* 26.5*  CREATININE 5.08* 4.83*  LATICACIDVEN 1.5  --     Estimated Creatinine Clearance: 21.8 mL/min (A) (by C-G formula based on SCr of 4.83 mg/dL (H)).    No Known Allergies  Antimicrobials this admission: Vancomycin Zosyn 6/13  >>    >>   Dose adjustments this admission:   Microbiology results: 6/13 BCx: pending 6/13 UCx: pending       6/13 UA: LE(tr) NO2(-) WBC 6-30 6/13 CXR: no active disease Thank you for allowing pharmacy to be a part of this patient's care.  Cheston Coury S 04/12/2017 5:59 AM

## 2017-04-12 NOTE — Progress Notes (Signed)
[facility] [place] NAME: Caleb Hughes    MR#:  [id number]  DATE OF BIRTH:  04/11/1961  SUBJECTIVE:  Feeling better, afebrile.  Weak REVIEW OF SYSTEMS:    Review of Systems  Constitutional: Positive for malaise/fatigue. Negative for chills, fever and weight loss.  HENT: Negative for congestion, nosebleeds, sore throat and tinnitus.   Eyes: Negative for blurred vision and double vision.  Respiratory: Negative for cough, shortness of breath and wheezing.   Cardiovascular: Negative for chest pain, orthopnea, leg swelling and PND.  Gastrointestinal: Negative for abdominal pain, constipation, diarrhea, heartburn, nausea and vomiting.  Genitourinary: Negative for dysuria, hematuria and urgency.  Musculoskeletal: Negative for back pain.  Skin: Negative for rash.  Neurological: Positive for weakness. Negative for dizziness, sensory change, speech change, focal weakness and headaches.  Endo/Heme/Allergies: Does not bruise/bleed easily.  Psychiatric/Behavioral: Negative for depression.  All other systems reviewed and are negative.   Nutrition: Carb control Tolerating Diet: Yes Tolerating PT: Await Eval.  DRUG ALLERGIES:  No Known Allergies  VITALS:  Blood pressure 115/72, pulse 98, temperature 98.3 F (36.8 C), temperature source Oral, resp. rate 20, height 6\' 2"  (1.88 m), weight 104.5 kg (230 lb 6.4 oz), SpO2 94 %.  PHYSICAL EXAMINATION:   Physical Exam  GENERAL:  56 y.o.-year-old obese male patient lying in the bed in no acute distress.  EYES: Pupils equal, round, reactive to light and accommodation. No scleral icterus. Extraocular muscles intact.  HEENT: Head atraumatic, normocephalic. Oropharynx and nasopharynx clear.  NECK:  Supple, no jugular venous distention. No thyroid enlargement, no tenderness.  LUNGS: Normal breath sounds bilaterally, no wheezing, rales, rhonchi. No use of accessory muscles of respiration.  CARDIOVASCULAR: S1,  S2 normal. No murmurs, rubs, or gallops.  ABDOMEN: Soft, nontender, nondistended. Bowel sounds present. No organomegaly or mass.  EXTREMITIES: No cyanosis, clubbing or edema b/l.    NEUROLOGIC: Cranial nerves II through XII are intact. No focal Motor or sensory deficits b/l.   PSYCHIATRIC: The patient is alert and oriented x 3.  SKIN: No obvious rash, lesion, or ulcer.    LABORATORY PANEL:   CBC  Recent Labs Lab 04/12/17 0317  WBC 26.5*  HGB 11.6*  HCT 35.9*  PLT 152   ------------------------------------------------------------------------------------------------------------------  Chemistries   Recent Labs Lab 04/11/17 1749 04/12/17 0317  NA 138 141  K 4.6 4.4  CL 108 114*  CO2 20* 19*  GLUCOSE 91 102*  BUN 72* 71*  CREATININE 5.08* 4.83*  CALCIUM 9.3 8.6*  AST 20  --   ALT 11*  --   ALKPHOS 86  --   BILITOT 1.0  --    ------------------------------------------------------------------------------------------------------------------  Cardiac Enzymes  Recent Labs Lab 04/11/17 1749  TROPONINI <0.03   ------------------------------------------------------------------------------------------------------------------  RADIOLOGY:  Dg Chest 2 View  Result Date: 04/11/2017 CLINICAL DATA:  Fever and hypoglycemia EXAM: CHEST  2 VIEW COMPARISON:  10/04/2016 FINDINGS: The heart size and mediastinal contours are within normal limits. Both lungs are clear. No acute nor suspicious osseous abnormalities. Degenerate change along the dorsal spine. IMPRESSION: No active cardiopulmonary disease. Electronically Signed   By: Ashley Royalty M.D.   On: 04/11/2017 18:36   Ct Head Wo Contrast  Result Date: 04/11/2017 CLINICAL DATA:  Altered mental status EXAM: CT HEAD WITHOUT CONTRAST TECHNIQUE: Contiguous axial images were obtained from the base of the skull through the vertex without intravenous contrast. COMPARISON:  07/08/2016 FINDINGS: Brain: No acute territorial infarction,  hemorrhage or intracranial mass  is seen. Old left parietal infarct. Mild atrophy. Stable ventricle size. Vascular: No hyperdense vessels.  No unexpected calcifications. Skull: No fracture or suspicious bone lesion. Sinuses/Orbits: Mild mucosal thickening in the ethmoid sinuses. No acute orbital abnormality. Other: None IMPRESSION: No definite CT evidence for acute intracranial abnormality. Electronically Signed   By: Donavan Foil M.D.   On: 04/11/2017 20:05   ASSESSMENT AND PLAN:  56 year old male with past medical history of chronic kidney disease stage III, diabetes, obesity, hypertension, history of gout, hyperlipidemia presented to the hospital due to weakness  * Sepsis ([place]) - Ruled out  * Generalized weakness: Get physical therapy evaluation, this could be multifactorial.  He feels he had some hypoglycemia event at home. Sugar is well controlled here, recommend backing off of his insulin at home.  On discharge    * Type II diabetes mellitus with manifestations (HCC) - sliding scale insulin with corresponding glucose checks    * Hypertension - patient's blood pressure is improved, holding home antihypertensives for now   * CKD (chronic kidney disease), stage IV (HCC) - continue home meds for this, monitor closely,  - Creatinine at baseline  * Gout-no acute attack-continue colchicine.  * Hyperlipidemia-continue Zocor.   Likely d/c home tomorrow if Feeling better  All the records are reviewed and case discussed with Care Management/Social Worker. Management plans discussed with the patient, nursing and they are in agreement.  CODE STATUS: Full  DVT Prophylaxis: Heparin subcutaneous  TOTAL TIME TAKING CARE OF THIS PATIENT: 25 minutes.   POSSIBLE D/C IN 1-2 DAYS, DEPENDING ON CLINICAL CONDITION.   Max Sane M.D on 04/12/2017 at 8:07 AM  Between 7am to 6pm - Pager - [phone]  After 6pm go to [email or web address] - password [email or web address] [facility]  [facility]  Office   [phone]  CC: Primary care physician; [facility] [place]

## 2017-04-12 NOTE — Evaluation (Signed)
Physical Therapy Evaluation Patient Details Name: Marky Buresh MRN: [id number] DOB: 04/11/1961 Today's Date: 04/12/2017   History of Present Illness  56 y.o. male who presents with Malaise, "sweats", urinary hesitancy. Patient told his son today he wasn't feeling well, he felt like his blood pressure was high and his glucose level was low. He had the above stated symptoms, and was brought to the ED for evaluation. Here he was found to be tachycardic, with an elevated white blood cell count. His blood glucose level was okay, but his blood pressure was borderline low. Patient has chronic kidney disease, stage IV borderline stage V, but is not on dialysis. Evaluation here shows no evidence of pneumonia; admitted with sepsis.  Clinical Impression  Pt did well with PT walking ~300 ft and negotiating steps w/o assist.  He had no safety concerns/LOBs and though he had some HR increasing to 100s his O2 stayed in the high 90s and he did not endorse any significant fatigue.     Follow Up Recommendations No PT follow up    Equipment Recommendations       Recommendations for Other Services       Precautions / Restrictions Precautions Precautions: Fall Restrictions Weight Bearing Restrictions: No      Mobility  Bed Mobility Overal bed mobility: Independent             General bed mobility comments: Pt able to rise easily and w/o hesitation  Transfers Overall transfer level: Independent Equipment used: Straight cane             General transfer comment: Pt confidently and safely got to standing w/o assist  Ambulation/Gait Ambulation/Gait assistance: Independent Ambulation Distance (Feet): 300 Feet Assistive device: Straight cane       General Gait Details: Pt walked with good confidence, good safety and did not have excessive fatigue.    Stairs Stairs: Yes Stairs assistance: Independent Stair Management: One rail Left;With cane Number of Stairs: 6 General stair  comments: Pt did steps easily and confidently  Wheelchair Mobility    Modified Rankin (Stroke Patients Only)       Balance Overall balance assessment: Independent                                           Pertinent Vitals/Pain Pain Assessment: No/denies pain    Home Living Family/patient expects to be discharged to:: Private residence Living Arrangements: Children Available Help at Discharge: Family Type of Home: Apartment Home Access: Elevator (states he regularly uses steps)       Home Equipment: Cane - single point      Prior Function Level of Independence: Independent         Comments: Pt does not drive, but is out regularly with son     Hand Dominance        Extremity/Trunk Assessment   Upper Extremity Assessment Upper Extremity Assessment: Overall WFL for tasks assessed    Lower Extremity Assessment Lower Extremity Assessment: Overall WFL for tasks assessed       Communication      Cognition Arousal/Alertness: Awake/alert Behavior During Therapy: WFL for tasks assessed/performed Overall Cognitive Status: Within Functional Limits for tasks assessed  General Comments      Exercises     Assessment/Plan    PT Assessment Patent does not need any further PT services  PT Problem List         PT Treatment Interventions      PT Goals (Current goals can be found in the Care Plan section)  Acute Rehab PT Goals Patient Stated Goal: go home PT Goal Formulation: All assessment and education complete, DC therapy    Frequency     Barriers to discharge        Co-evaluation               AM-PAC PT "6 Clicks" Daily Activity  Outcome Measure Difficulty turning over in bed (including adjusting bedclothes, sheets and blankets)?: None Difficulty moving from lying on back to sitting on the side of the bed? : None Difficulty sitting down on and standing up from a  chair with arms (e.g., wheelchair, bedside commode, etc,.)?: None Help needed moving to and from a bed to chair (including a wheelchair)?: None Help needed walking in hospital room?: None Help needed climbing 3-5 steps with a railing? : None 6 Click Score: 24    End of Session Equipment Utilized During Treatment: Gait belt Activity Tolerance: Patient tolerated treatment well Patient left: with chair alarm set;with call bell/phone within reach   PT Visit Diagnosis: Muscle weakness (generalized) (M62.81);Difficulty in walking, not elsewhere classified (R26.2)    Time: 2924-4628 PT Time Calculation (min) (ACUTE ONLY): 14 min   Charges:   PT Evaluation $PT Eval Low Complexity: 1 Procedure     PT G Codes:        Kreg Shropshire, DPT 04/12/2017, 11:22 AM

## 2017-04-12 NOTE — Progress Notes (Signed)
Nutrition Brief Note  Patient identified on the Malnutrition Screening Tool (MST) Report  Wt Readings from Last 15 Encounters:  04/12/17 230 lb 6.4 oz (104.5 kg)  10/04/16 230 lb 9.6 oz (104.6 kg)  07/08/16 269 lb (122 kg)  07/04/16 269 lb (122 kg)  06/12/16 270 lb (122.5 kg)  03/16/16 279 lb 9.6 oz (126.8 kg)   Spoke with patient at bedside. He reports he only didn't eat well for one day PTA. Reports his appetite has returned and he is eating well now. Usually eats 100% of three meals daily. Denies any abdominal pain or N/V. Reports he has been weight stable lately. Does endorse that he used to weigh approximately 280 lbs, but lost weight intentionally under direction from his nephrologist. Reports he did not change his diet but started exercising to lose weight. He has lost approximately 50 lbs (17.8% body weight) over the past year, which is not significant for time frame.  Nutrition-Focused physical exam completed. Findings are no fat depletion, no muscle depletion, and no edema.   Body mass index is 29.58 kg/m. Patient meets criteria for overweight based on current BMI.   Current diet order is Carbohydrate Modified, patient is consuming approximately 100% of meals at this time. Labs and medications reviewed.   No nutrition interventions warranted at this time. If nutrition issues arise, please consult RD.   Willey Blade, MS, RD, LDN Pager: [phone] After Hours Pager: [phone]

## 2017-04-13 LAB — URINE CULTURE

## 2017-04-13 LAB — BASIC METABOLIC PANEL
Anion gap: 4 — ABNORMAL LOW (ref 5–15)
BUN: 67 mg/dL — AB (ref 6–20)
CHLORIDE: 117 mmol/L — AB (ref 101–111)
CO2: 21 mmol/L — ABNORMAL LOW (ref 22–32)
Calcium: 8.7 mg/dL — ABNORMAL LOW (ref 8.9–10.3)
Creatinine, Ser: 4.43 mg/dL — ABNORMAL HIGH (ref 0.61–1.24)
GFR calc Af Amer: 16 mL/min — ABNORMAL LOW (ref >60)
GFR, EST NON AFRICAN AMERICAN: 14 mL/min — AB (ref >60)
GLUCOSE: 96 mg/dL (ref 65–99)
POTASSIUM: 4.3 mmol/L (ref 3.5–5.1)
Sodium: 142 mmol/L (ref 135–145)

## 2017-04-13 LAB — CBC
HCT: 33.7 % — ABNORMAL LOW (ref 40.0–52.0)
Hemoglobin: 11.1 g/dL — ABNORMAL LOW (ref 13.0–18.0)
MCH: 25.9 pg — AB (ref 26.0–34.0)
MCHC: 33 g/dL (ref 32.0–36.0)
MCV: 78.5 fL — ABNORMAL LOW (ref 80.0–100.0)
PLATELETS: 151 10*3/uL (ref 150–440)
RBC: 4.3 MIL/uL — AB (ref 4.40–5.90)
RDW: 16.5 % — ABNORMAL HIGH (ref 11.5–14.5)
WBC: 17.2 10*3/uL — ABNORMAL HIGH (ref 3.8–10.6)

## 2017-04-13 LAB — GLUCOSE, CAPILLARY: Glucose-Capillary: 97 mg/dL (ref 65–99)

## 2017-04-13 LAB — HEMOGLOBIN A1C
Hgb A1c MFr Bld: 5.6 % (ref 4.8–5.6)
MEAN PLASMA GLUCOSE: 114 mg/dL

## 2017-04-13 LAB — HIV ANTIBODY (ROUTINE TESTING W REFLEX): HIV Screen 4th Generation wRfx: NONREACTIVE

## 2017-04-13 NOTE — Progress Notes (Signed)
Pt is being discharged today. IV x2 removed, all belongings packed and returned to the patient. Discharge instructions reviewed with the patient. 0 paper prescriptions given. Pt is currently waiting on his ride. He will be rolled out in a wheelchair per staff.

## 2017-04-13 NOTE — Discharge Instructions (Signed)
Weakness Weakness is a lack of strength. You may feel weak all over your body (generalized), or you may feel weak in one specific part of your body (focal). There are many potential causes of weakness. Sometimes, the cause of your weakness may not be known. Some causes of weakness can be serious, so it is important to see your doctor. Follow these instructions at home:  Rest as needed.  Try to get enough sleep. Talk to your doctor about how much sleep you need each night.  Take over-the-counter and prescription medicines only as told by your doctor.  Eat a healthy, well-balanced diet. This includes: ? Proteins to build muscles, such as lean meats and fish. ? Fresh fruits and vegetables. ? Carbohydrates to boost energy, such as whole grains.  Drink enough fluid to keep your pee (urine) clear or pale yellow.  Do strength exercises, such as arm curls and leg raises, for 30 minutes at least 2 days a week or as told by your doctor.  Think about working with a physical therapist or trainer to help you get stronger.  Keep all follow-up visits as told by your doctor. This is important. Contact a doctor if:  Your weakness does not get better or it gets worse.  Your weakness affects your ability to: ? Think clearly. ? Do your normal daily activities. Get help right away if:  You have sudden weakness.  You have trouble breathing or shortness of breath.  You have problems with your vision.  You have trouble talking or swallowing.  You have trouble standing or walking.  You have chest pain.  You are light-headed.  You pass out (lose consciousness). This information is not intended to replace advice given to you by your health care provider. Make sure you discuss any questions you have with your health care provider. Document Released: 09/28/2008 Document Revised: 11/11/2015 Document Reviewed: 08/06/2015 Elsevier Interactive Patient Education  2018 Elsevier Inc.  

## 2017-04-13 NOTE — Discharge Summary (Signed)
[facility] [place] NAME: Caleb Hughes    MR#:  [id number]  DATE OF BIRTH:  04/11/1961  DATE OF ADMISSION:  04/11/2017   ADMITTING PHYSICIAN: Lance Coon, MD  DATE OF DISCHARGE: 04/13/2017  1:04 PM  PRIMARY CARE PHYSICIAN: [facility]   ADMISSION DIAGNOSIS:  Hypoglycemia [E16.2] Sepsis, due to unspecified organism ([place]) [A41.9] Hematemesis with nausea [K92.0] DISCHARGE DIAGNOSIS:  Principal Problem:   Sepsis ([place]) Active Problems:   Type II diabetes mellitus with manifestations ([place])   Hypertension   Hyperlipemia   CKD (chronic kidney disease), stage IV ([place])  SECONDARY DIAGNOSIS:   Past Medical History:  Diagnosis Date  . Diabetes mellitus without complication ([place])   . Gout   . Hyperlipemia   . Hypertension   . Morbid obesity [facility])    HOSPITAL COURSE:  56 year old male with past medical history of chronic kidney disease stage III, diabetes, obesity, hypertension, history of gout, hyperlipidemia admitted to the hospital due to weakness  * Sepsis ([place]) - Ruled out  * Generalized weakness: multifactorial.  He feels he had some hypoglycemia event at home. Sugar is well controlled here. May have had viral exanthem.  * Possible hypoglycemia at home: I've requested him to take orange juice if need, or reduce dose of insulin if deemed necessary per his PCP  DISCHARGE CONDITIONS:  stable CONSULTS OBTAINED:   DRUG ALLERGIES:  No Known Allergies DISCHARGE MEDICATIONS:   Allergies as of 04/13/2017   No Known Allergies     Medication List    STOP taking these medications   baclofen 10 MG tablet Commonly known as:  LIORESAL     TAKE these medications   amLODipine 10 MG tablet Commonly known as:  NORVASC Take 10 mg by mouth daily.   aspirin EC 81 MG tablet Take 81 mg by mouth daily.   atorvastatin 80 MG tablet Commonly known as:  LIPITOR Take 80 mg by mouth daily.   carvedilol 12.5 MG  tablet Commonly known as:  COREG Take 1 tablet by mouth 2 (two) times daily.   cloNIDine 0.1 MG tablet Commonly known as:  CATAPRES Take 1 tablet (0.1 mg total) by mouth 2 (two) times daily.   famotidine 20 MG tablet Commonly known as:  PEPCID Take 20 mg by mouth daily.   hydrALAZINE 25 MG tablet Commonly known as:  APRESOLINE Take 25 mg by mouth 3 (three) times daily.   insulin NPH Human 100 UNIT/ML injection Commonly known as:  HUMULIN N,NOVOLIN N Inject 3 Units into the skin 2 (two) times daily with a meal.   levETIRAcetam 500 MG tablet Commonly known as:  KEPPRA Take 500 mg by mouth 3 (three) times a week. Tues,thurs,saturday   Melatonin 3 MG Tabs Take 1 tablet by mouth daily.   NOVOLOG MIX 70/30 (70-30) 100 UNIT/ML injection Generic drug:  insulin aspart protamine- aspart Inject 0.08 mLs (8 Units total) into the skin 2 (two) times daily with a meal.   ondansetron 4 MG tablet Commonly known as:  ZOFRAN Take 1 tablet (4 mg total) by mouth daily as needed.   pantoprazole 40 MG tablet Commonly known as:  PROTONIX Take 40 mg by mouth daily.   polyethylene glycol packet Commonly known as:  MIRALAX / GLYCOLAX Take 17 g by mouth daily.   RENVELA 800 MG tablet Generic drug:  sevelamer carbonate Take 1 tablet by mouth 3 (three) times daily.   senna 8.6 MG Tabs tablet Commonly  known as:  SENOKOT Take 1 tablet by mouth at bedtime.   simvastatin 20 MG tablet Commonly known as:  ZOCOR Take 1 tablet by mouth daily.   traMADol 50 MG tablet Commonly known as:  ULTRAM Take 1 tablet (50 mg total) by mouth every 6 (six) hours as needed.   Vitamin D (Ergocalciferol) 50000 units Caps capsule Commonly known as:  DRISDOL Take 1 capsule by mouth once a week.        DISCHARGE INSTRUCTIONS:   DIET:  Diabetic diet DISCHARGE CONDITION:  Stable ACTIVITY:  Activity as tolerated OXYGEN:  Home Oxygen: No.  Oxygen Delivery: room air DISCHARGE LOCATION:  home   If  you experience worsening of your admission symptoms, develop shortness of breath, life threatening emergency, suicidal or homicidal thoughts you must seek medical attention immediately by calling 911 or calling your MD immediately  if symptoms less severe.  You Must read complete instructions/literature along with all the possible adverse reactions/side effects for all the Medicines you take and that have been prescribed to you. Take any new Medicines after you have completely understood and accpet all the possible adverse reactions/side effects.   Please note  You were cared for by a hospitalist during your hospital stay. If you have any questions about your discharge medications or the care you received while you were in the hospital after you are discharged, you can call the unit and asked to speak with the hospitalist on call if the hospitalist that took care of you is not available. Once you are discharged, your primary care physician will handle any further medical issues. Please note that NO REFILLS for any discharge medications will be authorized once you are discharged, as it is imperative that you return to your primary care physician (or establish a relationship with a primary care physician if you do not have one) for your aftercare needs so that they can reassess your need for medications and monitor your lab values.    On the day of Discharge:  VITAL SIGNS:  Blood pressure 133/86, pulse (!) 104, temperature 98.2 F (36.8 C), temperature source Oral, resp. rate 14, height 6\' 2"  (1.88 m), weight 112.7 kg (248 lb 8 oz), SpO2 100 %. PHYSICAL EXAMINATION:  GENERAL:  56 y.o.-year-old patient lying in the bed with no acute distress.  EYES: Pupils equal, round, reactive to light and accommodation. No scleral icterus. Extraocular muscles intact.  HEENT: Head atraumatic, normocephalic. Oropharynx and nasopharynx clear.  NECK:  Supple, no jugular venous distention. No thyroid enlargement, no  tenderness.  LUNGS: Normal breath sounds bilaterally, no wheezing, rales,rhonchi or crepitation. No use of accessory muscles of respiration.  CARDIOVASCULAR: S1, S2 normal. No murmurs, rubs, or gallops.  ABDOMEN: Soft, non-tender, non-distended. Bowel sounds present. No organomegaly or mass.  EXTREMITIES: No pedal edema, cyanosis, or clubbing.  NEUROLOGIC: Cranial nerves II through XII are intact. Muscle strength 5/5 in all extremities. Sensation intact. Gait not checked.  PSYCHIATRIC: The patient is alert and oriented x 3.  SKIN: No obvious rash, lesion, or ulcer.  DATA REVIEW:   CBC  Recent Labs Lab 04/13/17 0510  WBC 17.2*  HGB 11.1*  HCT 33.7*  PLT 151    Chemistries   Recent Labs Lab 04/11/17 1749  04/13/17 0510  NA 138  < > 142  K 4.6  < > 4.3  CL 108  < > 117*  CO2 20*  < > 21*  GLUCOSE 91  < > 96  BUN 72*  < >  67*  CREATININE 5.08*  < > 4.43*  CALCIUM 9.3  < > 8.7*  AST 20  --   --   ALT 11*  --   --   ALKPHOS 86  --   --   BILITOT 1.0  --   --   < > = values in this interval not displayed.  Follow-up Information    [facility], [place]. Schedule an appointment as soon as possible for a visit in 1 week(s).   Contact information: [place] [address] [phone]           Management plans discussed with the patient, family and they are in agreement.  CODE STATUS: Prior   TOTAL TIME TAKING CARE OF THIS PATIENT: 45 minutes.    Max Sane M.D on 04/13/2017 at 7:23 PM  Between 7am to 6pm - Pager - [phone]  After 6pm go to [email or web address] - Proofreader  [facility]  Office  [phone]  CC: Primary care physician; [facility]   Note: This dictation was prepared with Dragon dictation along with smaller phrase technology. Any transcriptional errors that result from this process are unintentional.

## 2017-04-15 LAB — BLOOD GAS, VENOUS
Acid-base deficit: 6.7 mmol/L — ABNORMAL HIGH (ref 0.0–2.0)
Bicarbonate: 19.7 mmol/L — ABNORMAL LOW (ref 20.0–28.0)
PH VEN: 7.28 (ref 7.250–7.430)
PO2 VEN: UNDETERMINED mmHg (ref 32.0–45.0)
Patient temperature: 37
pCO2, Ven: 42 mmHg — ABNORMAL LOW (ref 44.0–60.0)

## 2017-04-16 LAB — CULTURE, BLOOD (ROUTINE X 2)
CULTURE: NO GROWTH
Culture: NO GROWTH
SPECIAL REQUESTS: ADEQUATE
Special Requests: ADEQUATE

## 2018-09-15 ENCOUNTER — Encounter: Payer: Self-pay | Admitting: Emergency Medicine

## 2018-09-15 ENCOUNTER — Emergency Department: Payer: Medicare Other

## 2018-09-15 ENCOUNTER — Inpatient Hospital Stay
Admission: EM | Admit: 2018-09-15 | Discharge: 2018-09-18 | DRG: 683 | Disposition: A | Discharge status: Home Health | Payer: Medicare Other | Attending: Internal Medicine | Admitting: Internal Medicine

## 2018-09-15 ENCOUNTER — Other Ambulatory Visit: Payer: Self-pay

## 2018-09-15 DIAGNOSIS — E872 Acidosis: Secondary | ICD-10-CM | POA: Diagnosis present

## 2018-09-15 DIAGNOSIS — M109 Gout, unspecified: Secondary | ICD-10-CM | POA: Diagnosis present

## 2018-09-15 DIAGNOSIS — Z8249 Family history of ischemic heart disease and other diseases of the circulatory system: Secondary | ICD-10-CM

## 2018-09-15 DIAGNOSIS — Z86010 Personal history of colonic polyps: Secondary | ICD-10-CM

## 2018-09-15 DIAGNOSIS — Z9115 Patient's noncompliance with renal dialysis: Secondary | ICD-10-CM

## 2018-09-15 DIAGNOSIS — N179 Acute kidney failure, unspecified: Principal | ICD-10-CM | POA: Diagnosis present

## 2018-09-15 DIAGNOSIS — Y92009 Unspecified place in unspecified non-institutional (private) residence as the place of occurrence of the external cause: Secondary | ICD-10-CM

## 2018-09-15 DIAGNOSIS — E785 Hyperlipidemia, unspecified: Secondary | ICD-10-CM | POA: Diagnosis present

## 2018-09-15 DIAGNOSIS — M79604 Pain in right leg: Secondary | ICD-10-CM | POA: Diagnosis present

## 2018-09-15 DIAGNOSIS — K219 Gastro-esophageal reflux disease without esophagitis: Secondary | ICD-10-CM | POA: Diagnosis present

## 2018-09-15 DIAGNOSIS — E1140 Type 2 diabetes mellitus with diabetic neuropathy, unspecified: Secondary | ICD-10-CM | POA: Diagnosis present

## 2018-09-15 DIAGNOSIS — Z794 Long term (current) use of insulin: Secondary | ICD-10-CM

## 2018-09-15 DIAGNOSIS — Z841 Family history of disorders of kidney and ureter: Secondary | ICD-10-CM

## 2018-09-15 DIAGNOSIS — G40909 Epilepsy, unspecified, not intractable, without status epilepticus: Secondary | ICD-10-CM | POA: Diagnosis present

## 2018-09-15 DIAGNOSIS — N189 Chronic kidney disease, unspecified: Secondary | ICD-10-CM

## 2018-09-15 DIAGNOSIS — M79605 Pain in left leg: Secondary | ICD-10-CM | POA: Diagnosis present

## 2018-09-15 DIAGNOSIS — I10 Essential (primary) hypertension: Secondary | ICD-10-CM | POA: Diagnosis present

## 2018-09-15 DIAGNOSIS — E1122 Type 2 diabetes mellitus with diabetic chronic kidney disease: Secondary | ICD-10-CM | POA: Diagnosis present

## 2018-09-15 DIAGNOSIS — N186 End stage renal disease: Secondary | ICD-10-CM | POA: Diagnosis present

## 2018-09-15 DIAGNOSIS — D631 Anemia in chronic kidney disease: Secondary | ICD-10-CM | POA: Diagnosis present

## 2018-09-15 DIAGNOSIS — I120 Hypertensive chronic kidney disease with stage 5 chronic kidney disease or end stage renal disease: Secondary | ICD-10-CM | POA: Diagnosis present

## 2018-09-15 DIAGNOSIS — E118 Type 2 diabetes mellitus with unspecified complications: Secondary | ICD-10-CM | POA: Diagnosis present

## 2018-09-15 DIAGNOSIS — W07XXXA Fall from chair, initial encounter: Secondary | ICD-10-CM | POA: Diagnosis present

## 2018-09-15 DIAGNOSIS — Z79899 Other long term (current) drug therapy: Secondary | ICD-10-CM

## 2018-09-15 DIAGNOSIS — N2581 Secondary hyperparathyroidism of renal origin: Secondary | ICD-10-CM | POA: Diagnosis present

## 2018-09-15 DIAGNOSIS — Z9119 Patient's noncompliance with other medical treatment and regimen: Secondary | ICD-10-CM

## 2018-09-15 LAB — CBC WITH DIFFERENTIAL/PLATELET
ABS IMMATURE GRANULOCYTES: 0.28 10*3/uL — AB (ref 0.00–0.07)
BASOS ABS: 0 10*3/uL (ref 0.0–0.1)
Basophils Relative: 0 %
Eosinophils Absolute: 0 10*3/uL (ref 0.0–0.5)
Eosinophils Relative: 0 %
HCT: 38.9 % — ABNORMAL LOW (ref 39.0–52.0)
HEMOGLOBIN: 12.1 g/dL — AB (ref 13.0–17.0)
Immature Granulocytes: 2 %
LYMPHS PCT: 6 %
Lymphs Abs: 0.8 10*3/uL (ref 0.7–4.0)
MCH: 24.6 pg — ABNORMAL LOW (ref 26.0–34.0)
MCHC: 31.1 g/dL (ref 30.0–36.0)
MCV: 79.2 fL — ABNORMAL LOW (ref 80.0–100.0)
Monocytes Absolute: 2.7 10*3/uL — ABNORMAL HIGH (ref 0.1–1.0)
Monocytes Relative: 19 %
NEUTROS ABS: 10.2 10*3/uL — AB (ref 1.7–7.7)
NEUTROS PCT: 73 %
NRBC: 0 % (ref 0.0–0.2)
PLATELETS: 189 10*3/uL (ref 150–400)
RBC: 4.91 MIL/uL (ref 4.22–5.81)
RDW: 14.6 % (ref 11.5–15.5)
WBC: 14 10*3/uL — ABNORMAL HIGH (ref 4.0–10.5)

## 2018-09-15 LAB — BASIC METABOLIC PANEL
Anion gap: 12 (ref 5–15)
BUN: 71 mg/dL — ABNORMAL HIGH (ref 6–20)
CO2: 19 mmol/L — ABNORMAL LOW (ref 22–32)
CREATININE: 6.23 mg/dL — AB (ref 0.61–1.24)
Calcium: 9.5 mg/dL (ref 8.9–10.3)
Chloride: 113 mmol/L — ABNORMAL HIGH (ref 98–111)
GFR, EST AFRICAN AMERICAN: 10 mL/min — AB (ref >60)
GFR, EST NON AFRICAN AMERICAN: 9 mL/min — AB (ref >60)
Glucose, Bld: 130 mg/dL — ABNORMAL HIGH (ref 70–99)
POTASSIUM: 4.7 mmol/L (ref 3.5–5.1)
Sodium: 144 mmol/L (ref 135–145)

## 2018-09-15 LAB — GLUCOSE, CAPILLARY: GLUCOSE-CAPILLARY: 120 mg/dL — AB (ref 70–99)

## 2018-09-15 LAB — URIC ACID: URIC ACID, SERUM: 14.9 mg/dL — AB (ref 3.7–8.6)

## 2018-09-15 MED ORDER — HYDROCODONE-ACETAMINOPHEN 5-325 MG PO TABS
1.0000 | ORAL_TABLET | Freq: Once | ORAL | Status: AC
  Administered 2018-09-15: 1 via ORAL
  Filled 2018-09-15: qty 1

## 2018-09-15 MED ORDER — COLCHICINE 0.6 MG PO TABS
1.2000 mg | ORAL_TABLET | Freq: Once | ORAL | Status: AC
  Administered 2018-09-15: 1.2 mg via ORAL
  Filled 2018-09-15 (×2): qty 2

## 2018-09-15 NOTE — ED Notes (Signed)
Report to tony, rn

## 2018-09-15 NOTE — ED Provider Notes (Signed)
[facility] Emergency Department Provider Note ____________________________________________  Time seen: 2104  I have reviewed the triage vital signs and the nursing notes.  HISTORY  Chief Complaint  Leg Pain  HPI Caleb Hughes is a 57 y.o. male who presents to the ED via EMS from home, for evaluation of right greater than left leg pain.  Patient reports pain to both legs that has been intermittent for the last 2 weeks.  He describes the onset was a day or 2 after he reportedly had a routine colonoscopy with polypectomy.  Patient reports he awoke from anesthesia without incident, and assumes that he must have hurt his hips and legs when he rotated on the table prior to the procedure.  He ambulates at home with the use of a single-point cane, walker, in a wheelchair.  He reports he spends the majority of his time in a wheelchair.  He reports that today he slipped out of his reclining chair, and slid onto the ground.  He was home alone at the time of the incident.  He apparently called his sister who came to his aid, but she ultimately called EMS to help get the patient up.  He reports he was down for about 30 to 45 minutes in total.  He denies any head injury, loss of consciousness, nausea, vomiting, or dizziness preceding his slip and fall.  He presents now with pain primarily to the right leg that hurts when he attempts to move it.  He reports pain the entire length of the leg without any specific area of concern.  He has a history of diabetes, acute renal failure, hypertension, chronic kidney disease, and diabetic neuropathy in his feet.  Past Medical History:  Diagnosis Date  . Diabetes mellitus without complication ([place])   . Gout   . Hyperlipemia   . Hypertension   . Morbid obesity [facility])     Patient Active Problem List   Diagnosis Date Noted  . Sepsis ([place]) 04/11/2017  . CKD (chronic kidney disease), stage IV ([place]) 04/11/2017  . AKI (acute kidney injury) ([place])  06/12/2016  . 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 Elevated troponin 03/13/2016  . Hyperkalemia 03/13/2016  . Morbid obesity ([place]) 03/13/2016  . Hypertension   . Hyperlipemia   . Gout   . ARF (acute renal failure) ([place]) 03/12/2016  . Dehydration 03/12/2016  . Intractable nausea and vomiting 03/12/2016  . Type II diabetes mellitus with manifestations ([place]) 03/12/2016    Past Surgical History:  Procedure Laterality Date  . NO PAST SURGERIES      Prior to Admission medications   Medication Sig Start Date End Date Taking? Authorizing Provider  amLODipine (NORVASC) 10 MG tablet Take 10 mg by mouth daily.    [provider]  aspirin EC 81 MG tablet Take 81 mg by mouth daily.    [provider]  atorvastatin (LIPITOR) 80 MG tablet Take 80 mg by mouth daily.    [provider]  carvedilol (COREG) 12.5 MG tablet Take 1 tablet by mouth 2 (two) times daily. 03/12/17   [provider]  cloNIDine (CATAPRES) 0.1 MG tablet Take 1 tablet (0.1 mg total) by mouth 2 (two) times daily. Patient not taking: Reported on 10/04/2016 06/15/16   Henreitta Leber, MD  famotidine (PEPCID) 20 MG tablet Take 20 mg by mouth daily. 03/12/17   [provider]  hydrALAZINE (APRESOLINE) 25 MG tablet Take 25 mg by mouth 3 (three) times daily.  01/12/17   [provider]  insulin NPH Human (HUMULIN N,NOVOLIN N) 100 UNIT/ML injection Inject 3 Units into the skin 2 (two) times daily with a meal. 09/23/16   [provider]  levETIRAcetam (KEPPRA) 500 MG tablet Take 500 mg by mouth 3 (three) times a week. Tues,thurs,saturday    [provider]  Melatonin 3 MG TABS Take 1 tablet by mouth daily.    [provider]  NOVOLOG MIX 70/30 (70-30) 100 UNIT/ML injection Inject 0.08 mLs (8 Units total) into the skin 2 (two) times daily with a meal. Patient not taking: Reported on 10/04/2016 03/16/16   Loletha Grayer, MD   ondansetron (ZOFRAN) 4 MG tablet Take 1 tablet (4 mg total) by mouth daily as needed. 10/04/16   Orbie Pyo, MD  pantoprazole (PROTONIX) 40 MG tablet Take 40 mg by mouth daily.    [provider]  polyethylene glycol (MIRALAX / GLYCOLAX) packet Take 17 g by mouth daily.    [provider]  RENVELA 800 MG tablet Take 1 tablet by mouth 3 (three) times daily. 01/15/17   [provider]  senna (SENOKOT) 8.6 MG TABS tablet Take 1 tablet by mouth at bedtime.    [provider]  simvastatin (ZOCOR) 20 MG tablet Take 1 tablet by mouth daily. 02/06/17   [provider]    Allergies Patient has no known allergies.  Family History  Problem Relation Age of Onset  . Hypertension Mother   . Diabetes type II Mother   . CAD Father   . Hypertension Father   . Alcohol abuse Father   . Alcohol abuse Brother   . Alcohol abuse Brother   . Kidney failure Brother   . Kidney failure Brother     Social History Social History   Tobacco Use  . Smoking status: Never Smoker  . Smokeless tobacco: Never Used  Substance Use Topics  . Alcohol use: No    Alcohol/week: 0.0 standard drinks  . Drug use: No    Review of Systems  Constitutional: Negative for fever. Eyes: Negative for visual changes. ENT: Negative for sore throat. Cardiovascular: Negative for chest pain. Respiratory: Negative for shortness of breath. Gastrointestinal: Negative for abdominal pain, vomiting and diarrhea. Genitourinary: Negative for dysuria. Musculoskeletal: Negative for back pain. Right leg pain and disability Skin: Negative for rash. Neurological: Negative for headaches, focal weakness or numbness. ____________________________________________  PHYSICAL EXAM:  VITAL SIGNS: ED Triage Vitals  Enc Vitals Group     BP 09/15/18 1913 130/77     Pulse Rate 09/15/18 1913 (!) 118     Resp 09/15/18 1913 20     Temp 09/15/18 1913 99.9 F (37.7 C)     Temp Source  09/15/18 1913 Oral     SpO2 09/15/18 1913 97 %     Weight 09/15/18 1912 249 lb (112.9 kg)     Height 09/15/18 1912 6\' 2"  (1.88 m)     Head Circumference --      Peak Flow --      Pain Score 09/15/18 1911 8     Pain Loc --      Pain Edu? --      Excl. in [place]? --     Constitutional: Alert and oriented. Well appearing and in no distress. Head: Normocephalic and atraumatic. Eyes: Conjunctivae are normal. Normal extraocular movements Cardiovascular: Normal rate, regular rhythm. Normal distal pulses. Respiratory: Normal respiratory effort. No wheezes/rales/rhonchi. Gastrointestinal: Soft and nontender. No distention. Normal bowel sounds.  Musculoskeletal: RLE without obvious deformity. Patient holds RLE in flexed/externally rotated hip position. Patient exquisitely tender to light touch over the RLE. Nontender with normal range of motion in all extremities.  Neurologic:  Normal speech and language. No gross focal neurologic deficits are appreciated. Skin:  Skin is warm, dry and intact. No rash noted. Psychiatric: Mood and affect are normal. Patient exhibits appropriate insight and judgment. ____________________________________________   LABS (pertinent positives/negatives)  Labs Reviewed  CBC WITH DIFFERENTIAL/PLATELET - Abnormal; Notable for the following components:      Result Value   WBC 14.0 (*)    Hemoglobin 12.1 (*)    HCT 38.9 (*)    MCV 79.2 (*)    MCH 24.6 (*)    Neutro Abs 10.2 (*)    Monocytes Absolute 2.7 (*)    Abs Immature Granulocytes 0.28 (*)    All other components within normal limits  BASIC METABOLIC PANEL - Abnormal; Notable for the following components:   Chloride 113 (*)    CO2 19 (*)    Glucose, Bld 130 (*)    BUN 71 (*)    Creatinine, Ser 6.23 (*)    GFR calc non Af Amer 9 (*)    GFR calc Af Amer 10 (*)    All other components within normal limits  URIC ACID - Abnormal; Notable for the following components:   Uric Acid, Serum 14.9 (*)    All other  components within normal limits  GLUCOSE, CAPILLARY - Abnormal; Notable for the following components:   Glucose-Capillary 120 (*)    All other components within normal limits  CBG MONITORING, ED  ____________________________________________   RADIOLOGY  Right Hip w/ Pelvis  Negative ____________________________________________  PROCEDURES  Procedures Norco 5-325 mg PO Colchicine 1.2 mg PO ____________________________________________  INITIAL IMPRESSION / ASSESSMENT AND PLAN / ED COURSE  Patient with ED evaluation of bilateral leg pain.  Patient was found to have pain to the right knee primarily.  He has had some intermittent pain over the last 2 weeks.  He denies any preceding injury or accident or trauma.  The etiology for his right leg pain is at this time is unclear.  He was found on exam to have a negative hip x-ray but his labs were concerning as they show an elevation in his creatinine to 6.23, which is an elevation of at least 2 points since his most recent previous creatinine, in 2018.  Patient currently is not on dialysis despite his chronic renal failure.  He has apparently been evaluated by his nephrologist who suggested he be placed on the kidney transplant recipient list as of last week.   Discussed the case with my attending provider Les Pou, MD, he suggested admission to the hospitalist service for evaluation of acute renal failure since the patient is not currently on dialysis.  Patient care will be transferred to the hospitalist for further management. ____________________________________________  FINAL CLINICAL IMPRESSION(S) / ED DIAGNOSES  Final diagnoses:  Acute renal failure, unspecified acute renal failure type [facility])      Carmie End, Dannielle Karvonen, PA-C 09/16/18 0014    Hinda Kehr, MD 09/16/18 [phone]

## 2018-09-15 NOTE — ED Triage Notes (Signed)
Patient coming [facility] for bilateral leg pain described as cramping post colonoscopy 2 weeks ago. Patient had 4 polyps removed. p has hx of gout and takes potassium supplements. Patient's systolic BP was 383 by palpation. patient's HR was 110, and CBG 144.

## 2018-09-15 NOTE — ED Notes (Signed)
Reported to lee  Pt transferred  By stretcher to 1 hall

## 2018-09-15 NOTE — ED Notes (Signed)
Pt with four person assist up to bed. Pt states he is not able to bear weight on right leg. Pt complains of pain to entire right leg.

## 2018-09-15 NOTE — ED Triage Notes (Signed)
Pt arrives via EMS with c/o bilateral leg pain. No swelling or injury noted to legs at this time. Pt denies any recent falls or back pain. Pt states that "there is no particular place" in his legs that hurts just "all the way down". Pt is ambulatory with cane due to leg weakness which has been going on for a long time. Pt states that "I have maracca in my feet" which has been flaring up for a long time. Pt is in NAD.

## 2018-09-16 ENCOUNTER — Observation Stay: Payer: Medicare Other

## 2018-09-16 DIAGNOSIS — K219 Gastro-esophageal reflux disease without esophagitis: Secondary | ICD-10-CM | POA: Diagnosis present

## 2018-09-16 DIAGNOSIS — N179 Acute kidney failure, unspecified: Secondary | ICD-10-CM | POA: Diagnosis present

## 2018-09-16 DIAGNOSIS — N189 Chronic kidney disease, unspecified: Secondary | ICD-10-CM

## 2018-09-16 LAB — URINALYSIS, ROUTINE W REFLEX MICROSCOPIC
Bilirubin Urine: NEGATIVE
Glucose, UA: NEGATIVE mg/dL
Ketones, ur: NEGATIVE mg/dL
NITRITE: NEGATIVE
Protein, ur: 100 mg/dL — AB
Specific Gravity, Urine: 1.01 (ref 1.005–1.030)
WBC, UA: >50 WBC/hpf — ABNORMAL HIGH (ref 0–5)
pH: 5 (ref 5.0–8.0)

## 2018-09-16 LAB — SYNOVIAL CELL COUNT + DIFF, W/ CRYSTALS
EOSINOPHILS-SYNOVIAL: 0 %
Lymphocytes-Synovial Fld: 2 %
MONOCYTE-MACROPHAGE-SYNOVIAL FLUID: 15 %
NEUTROPHIL, SYNOVIAL: 83 %
Other Cells-SYN: 0
WBC, SYNOVIAL: 27968 /mm3 — AB (ref 0–200)

## 2018-09-16 LAB — CBC
HCT: 35.5 % — ABNORMAL LOW (ref 39.0–52.0)
Hemoglobin: 11.1 g/dL — ABNORMAL LOW (ref 13.0–17.0)
MCH: 24.8 pg — ABNORMAL LOW (ref 26.0–34.0)
MCHC: 31.3 g/dL (ref 30.0–36.0)
MCV: 79.2 fL — AB (ref 80.0–100.0)
NRBC: 0 % (ref 0.0–0.2)
PLATELETS: 200 10*3/uL (ref 150–400)
RBC: 4.48 MIL/uL (ref 4.22–5.81)
RDW: 14.5 % (ref 11.5–15.5)
WBC: 13.5 10*3/uL — ABNORMAL HIGH (ref 4.0–10.5)

## 2018-09-16 LAB — BASIC METABOLIC PANEL
Anion gap: 10 (ref 5–15)
BUN: 74 mg/dL — ABNORMAL HIGH (ref 6–20)
CALCIUM: 9.1 mg/dL (ref 8.9–10.3)
CO2: 21 mmol/L — AB (ref 22–32)
CREATININE: 6.07 mg/dL — AB (ref 0.61–1.24)
Chloride: 113 mmol/L — ABNORMAL HIGH (ref 98–111)
GFR calc non Af Amer: 9 mL/min — ABNORMAL LOW (ref >60)
GFR, EST AFRICAN AMERICAN: 11 mL/min — AB (ref >60)
Glucose, Bld: 127 mg/dL — ABNORMAL HIGH (ref 70–99)
Potassium: 4.5 mmol/L (ref 3.5–5.1)
SODIUM: 144 mmol/L (ref 135–145)

## 2018-09-16 LAB — GLUCOSE, CAPILLARY
GLUCOSE-CAPILLARY: 116 mg/dL — AB (ref 70–99)
Glucose-Capillary: 91 mg/dL (ref 70–99)
Glucose-Capillary: 121 mg/dL — ABNORMAL HIGH (ref 70–99)
Glucose-Capillary: 128 mg/dL — ABNORMAL HIGH (ref 70–99)
Glucose-Capillary: 138 mg/dL — ABNORMAL HIGH (ref 70–99)

## 2018-09-16 LAB — PROTEIN / CREATININE RATIO, URINE
CREATININE, URINE: 115 mg/dL
PROTEIN CREATININE RATIO: 1.5 mg/mg{creat} — AB (ref 0.00–0.15)
TOTAL PROTEIN, URINE: 173 mg/dL

## 2018-09-16 LAB — PHOSPHORUS: Phosphorus: 3.3 mg/dL (ref 2.5–4.6)

## 2018-09-16 LAB — URIC ACID: Uric Acid, Serum: 14.6 mg/dL — ABNORMAL HIGH (ref 3.7–8.6)

## 2018-09-16 MED ORDER — MELATONIN 5 MG PO TABS
2.5000 mg | ORAL_TABLET | Freq: Every day | ORAL | Status: DC
  Administered 2018-09-16 – 2018-09-17 (×2): 2.5 mg via ORAL
  Filled 2018-09-16 (×3): qty 0.5

## 2018-09-16 MED ORDER — INSULIN DETEMIR 100 UNIT/ML ~~LOC~~ SOLN
3.0000 [IU] | Freq: Two times a day (BID) | SUBCUTANEOUS | Status: DC
  Administered 2018-09-16 – 2018-09-18 (×5): 3 [IU] via SUBCUTANEOUS
  Filled 2018-09-16 (×8): qty 0.03

## 2018-09-16 MED ORDER — ONDANSETRON HCL 4 MG PO TABS
4.0000 mg | ORAL_TABLET | Freq: Four times a day (QID) | ORAL | Status: DC | PRN
Start: 2018-09-16 — End: 2018-09-18

## 2018-09-16 MED ORDER — HYDRALAZINE HCL 25 MG PO TABS
25.0000 mg | ORAL_TABLET | Freq: Three times a day (TID) | ORAL | Status: DC
  Administered 2018-09-16 – 2018-09-18 (×7): 25 mg via ORAL
  Filled 2018-09-16 (×7): qty 1

## 2018-09-16 MED ORDER — DOCUSATE SODIUM 100 MG PO CAPS
100.0000 mg | ORAL_CAPSULE | Freq: Two times a day (BID) | ORAL | Status: DC
  Administered 2018-09-16 – 2018-09-17 (×4): 100 mg via ORAL
  Filled 2018-09-16 (×4): qty 1

## 2018-09-16 MED ORDER — AMLODIPINE BESYLATE 10 MG PO TABS
10.0000 mg | ORAL_TABLET | Freq: Every day | ORAL | Status: DC
  Administered 2018-09-16 – 2018-09-18 (×3): 10 mg via ORAL
  Filled 2018-09-16 (×3): qty 1

## 2018-09-16 MED ORDER — SEVELAMER CARBONATE 800 MG PO TABS
800.0000 mg | ORAL_TABLET | Freq: Three times a day (TID) | ORAL | Status: DC
  Administered 2018-09-16 – 2018-09-18 (×7): 800 mg via ORAL
  Filled 2018-09-16 (×8): qty 1

## 2018-09-16 MED ORDER — INSULIN ASPART 100 UNIT/ML ~~LOC~~ SOLN
0.0000 [IU] | Freq: Three times a day (TID) | SUBCUTANEOUS | Status: DC
  Administered 2018-09-16: 1 [IU] via SUBCUTANEOUS
  Administered 2018-09-17 (×2): 2 [IU] via SUBCUTANEOUS
  Administered 2018-09-17 – 2018-09-18 (×2): 1 [IU] via SUBCUTANEOUS
  Administered 2018-09-18: 2 [IU] via SUBCUTANEOUS
  Filled 2018-09-16 (×6): qty 1

## 2018-09-16 MED ORDER — PANTOPRAZOLE SODIUM 40 MG PO TBEC
40.0000 mg | DELAYED_RELEASE_TABLET | Freq: Every day | ORAL | Status: DC
  Administered 2018-09-16 – 2018-09-18 (×3): 40 mg via ORAL
  Filled 2018-09-16 (×4): qty 1

## 2018-09-16 MED ORDER — HEPARIN SODIUM (PORCINE) 5000 UNIT/ML IJ SOLN
5000.0000 [IU] | Freq: Three times a day (TID) | INTRAMUSCULAR | Status: DC
  Administered 2018-09-16 – 2018-09-18 (×7): 5000 [IU] via SUBCUTANEOUS
  Filled 2018-09-16 (×7): qty 1

## 2018-09-16 MED ORDER — INSULIN ASPART 100 UNIT/ML ~~LOC~~ SOLN: 0.0000 [IU] | Freq: Every day | SUBCUTANEOUS | Status: DC

## 2018-09-16 MED ORDER — LEVETIRACETAM 500 MG PO TABS: 500.0000 mg | ORAL_TABLET | ORAL | Status: DC

## 2018-09-16 MED ORDER — ACETAMINOPHEN 650 MG RE SUPP: 650.0000 mg | Freq: Four times a day (QID) | RECTAL | Status: DC | PRN

## 2018-09-16 MED ORDER — INSULIN NPH (HUMAN) (ISOPHANE) 100 UNIT/ML ~~LOC~~ SUSP: 3.0000 [IU] | Freq: Two times a day (BID) | SUBCUTANEOUS | Status: DC

## 2018-09-16 MED ORDER — ASPIRIN EC 81 MG PO TBEC
81.0000 mg | DELAYED_RELEASE_TABLET | Freq: Every day | ORAL | Status: DC
  Administered 2018-09-16 – 2018-09-18 (×3): 81 mg via ORAL
  Filled 2018-09-16 (×4): qty 1

## 2018-09-16 MED ORDER — ONDANSETRON HCL 4 MG/2ML IJ SOLN: 4.0000 mg | Freq: Four times a day (QID) | INTRAMUSCULAR | Status: DC | PRN

## 2018-09-16 MED ORDER — ACETAMINOPHEN 325 MG PO TABS
650.0000 mg | ORAL_TABLET | Freq: Four times a day (QID) | ORAL | Status: DC | PRN
  Administered 2018-09-16 (×2): 650 mg via ORAL
  Filled 2018-09-16 (×2): qty 2

## 2018-09-16 MED ORDER — PREDNISONE 50 MG PO TABS
50.0000 mg | ORAL_TABLET | Freq: Every day | ORAL | Status: DC
  Administered 2018-09-16 – 2018-09-18 (×3): 50 mg via ORAL
  Filled 2018-09-16 (×3): qty 1

## 2018-09-16 MED ORDER — SODIUM CHLORIDE 0.9 % IV SOLN
1.0000 g | INTRAVENOUS | Status: DC
  Administered 2018-09-16 – 2018-09-17 (×2): 1 g via INTRAVENOUS
  Filled 2018-09-16: qty 1
  Filled 2018-09-16: qty 10
  Filled 2018-09-16: qty 1

## 2018-09-16 MED ORDER — OXYCODONE HCL 5 MG PO TABS
5.0000 mg | ORAL_TABLET | ORAL | Status: DC | PRN
  Administered 2018-09-16: 5 mg via ORAL
  Filled 2018-09-16: qty 1

## 2018-09-16 MED ORDER — CARVEDILOL 12.5 MG PO TABS
12.5000 mg | ORAL_TABLET | Freq: Two times a day (BID) | ORAL | Status: DC
  Administered 2018-09-16 – 2018-09-18 (×5): 12.5 mg via ORAL
  Filled 2018-09-16 (×6): qty 1

## 2018-09-16 MED ORDER — ATORVASTATIN CALCIUM 20 MG PO TABS
80.0000 mg | ORAL_TABLET | Freq: Every day | ORAL | Status: DC
  Administered 2018-09-16 – 2018-09-18 (×3): 80 mg via ORAL
  Filled 2018-09-16 (×4): qty 4

## 2018-09-16 MED ORDER — HYDRALAZINE HCL 20 MG/ML IJ SOLN: 10.0000 mg | Freq: Four times a day (QID) | INTRAMUSCULAR | Status: DC | PRN

## 2018-09-16 NOTE — Consult Note (Signed)
Reason for Consult: Knee pain  Referring Physician: Hospitalist  Caleb Hughes   HPI: 57 year old African-American male.  History of chronic kidney disease.  Transient dialysis previously and is subclavian area.  History of diabetes. By his report he has had a 2-year history of gout.  Episodic.  Usually in toe or ankle.  Prior left knee drainage In the last month he had colonoscopy and after that he has had multiple joint pains.  Right knee than left knee then both ankles.  Some in his right.  Admitted.  Decreased ambulation.  Elevated white count at 13,000.  Uric acid 14.  Creatinine 6.0.  He says he has not been to have dialysis Some recent swelling and cramps .was given prednisone 50 mg.  And to use cane and walker to ambulate  PMH: Renal failure.  Diabetes.  SURGICAL HISTORY: No joint surgeries  Family History: No family history of gout  Social History: Denies recent cigarettes or alcohol  Allergies: No Known Allergies  Medications:  Scheduled: . amLODipine  10 mg Oral Daily  . aspirin EC  81 mg Oral Daily  . atorvastatin  80 mg Oral Daily  . carvedilol  12.5 mg Oral BID  . docusate sodium  100 mg Oral BID  . heparin  5,000 Units Subcutaneous Q8H  . hydrALAZINE  25 mg Oral TID  . insulin aspart  0-5 Units Subcutaneous QHS  . insulin aspart  0-9 Units Subcutaneous TID WC  . insulin detemir  3 Units Subcutaneous BID  . [START ON 09/17/2018] levETIRAcetam  500 mg Oral Once per day on Tue Thu Sat  . Melatonin  2.5 mg Oral QHS  . pantoprazole  40 mg Oral Daily  . predniSONE  50 mg Oral Q breakfast  . sevelamer carbonate  800 mg Oral TID WC        ROS: No abdominal pain.  No kidney stones.  Hands shoulders elbows have not been bothering him   PHYSICAL EXAM: Blood pressure 116/66, pulse (!) 105, temperature (!) 101.9 F (38.8 C), resp. rate 17, height 6\' 2"  (1.88 m), weight 114.4 kg, SpO2 95 %. Pleasant male.  No acute distress.  Sclera clear.  Trace lower extremity  edema. Musculoskeletal: Range of motion neck and shoulders.  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  No elbow tophi.  Hips move well.  Bilateral knee effusions.  Both ankles have pain with motion.  No significant effusion.  Right first MTP mildly tender.  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  Aspirated 40 cc of inflammatory fluid.  Injected with 1 cc Xylocaine: 1 cc Marcaine 1/2 cc Kenalog.  Sent for microscopy  Procedure: Left knee prepped in a sterile manner.spirated 40 cc of inflammatory fluid.  Injected with 1 cc Xylocaine: 1 cc Marcaine 1/2 cc Kenalog  Already on prednisone .  Consider low-dose allopurinol.  50 mg every other day, assuming patient is going to have dialysis     Caleb Hughes 09/16/2018, 5:30 PM

## 2018-09-16 NOTE — Progress Notes (Signed)
Inpatient Diabetes Program Recommendations  AACE/ADA: New Consensus Statement on Inpatient Glycemic Control (2015)  Target Ranges:  Prepandial:   less than 140 mg/dL      Peak postprandial:   less than 180 mg/dL (1-2 hours)      Critically ill patients:  140 - 180 mg/dL   Results for STRIDER, VALLANCE (MRN [id number]) as of 09/16/2018 10:07  Ref. Range 09/15/2018 22:53 09/16/2018 07:54  Glucose-Capillary Latest Ref Range: 70 - 99 mg/dL 120 (H) 91    Admit with: Acute on Chronic Renal Failure/ Gout  History: DM  Home DM Meds: NPH Insulin 3 units BID       70/30 Insulin- 8 units BID  Current Orders: Levemir 3 units BID      Novolog Sensitive Correction Scale/ SSI (0-9 units) TID AC + HS     Note patient to start on Prednisone 50 mg daily.  CBGs may trend upwards once steroids are on board.  Levemir also to start this AM.     --Will follow patient during hospitalization--  Wyn Quaker RN, MSN, CDE Diabetes Coordinator Inpatient Glycemic Control Team Team Pager: [phone] (8a-5p)

## 2018-09-16 NOTE — Progress Notes (Signed)
Chaplain responded to an OR for an AD. Pt said he is not interested . Chaplain asked if he could offer prayer. Pt accepted. Chaplain prayed Isaiah [phone]. By your stripes he is healed.    09/16/18 1000  Clinical Encounter Type  Visited With Patient  Visit Type Initial  Referral From Physician  Spiritual Encounters  Spiritual Needs Brochure;Prayer

## 2018-09-16 NOTE — H&P (Signed)
[facility] [place] NAME: Fields Oros    MR#:  [id number]  DATE OF BIRTH:  Oct 02, 1961  DATE OF ADMISSION:  09/15/2018  PRIMARY CARE PHYSICIAN: [facility]   REQUESTING/REFERRING PHYSICIAN: Menshew, PA  CHIEF COMPLAINT:   Chief Complaint  Patient presents with  . Leg Pain    HISTORY OF PRESENT ILLNESS:  Caleb Hughes  is a 57 y.o. male who presents with chief complaint as above.  Patient presents to the ED with a complaint of right knee pain.  He states that he has had gout in that knee previously and that this feels similar to that.  On evaluation here in the ED he was found to have acute on chronic renal failure, and he has end-stage renal disease.  He follows with nephrology but has not been initiated on dialysis yet.  Hospitalist were called for admission  PAST MEDICAL HISTORY:   Past Medical History:  Diagnosis Date  . Diabetes mellitus without complication ([place])   . Gout   . Hyperlipemia   . Hypertension   . Morbid obesity ([place])      PAST SURGICAL HISTORY:   Past Surgical History:  Procedure Laterality Date  . NO PAST SURGERIES       SOCIAL HISTORY:   Social History   Tobacco Use  . Smoking status: Never Smoker  . Smokeless tobacco: Never Used  Substance Use Topics  . Alcohol use: No    Alcohol/week: 0.0 standard drinks     FAMILY HISTORY:   Family History  Problem Relation Age of Onset  . Hypertension Mother   . Diabetes type II Mother   . CAD Father   . Hypertension Father   . Alcohol abuse Father   . Alcohol abuse Brother   . Alcohol abuse Brother   . Kidney failure Brother   . Kidney failure Brother      DRUG ALLERGIES:  No Known Allergies  MEDICATIONS AT HOME:   Prior to Admission medications   Medication Sig Start Date End Date Taking? Authorizing Provider  insulin NPH-regular Human (70-30) 100 UNIT/ML injection Inject 8 Units into the skin 2 (two) times daily  with a meal.   Yes [provider]  amLODipine (NORVASC) 10 MG tablet Take 10 mg by mouth daily.    [provider]  aspirin EC 81 MG tablet Take 81 mg by mouth daily.    [provider]  atorvastatin (LIPITOR) 80 MG tablet Take 80 mg by mouth daily.    [provider]  carvedilol (COREG) 12.5 MG tablet Take 1 tablet by mouth 2 (two) times daily. 03/12/17   [provider]  cloNIDine (CATAPRES) 0.1 MG tablet Take 1 tablet (0.1 mg total) by mouth 2 (two) times daily. Patient not taking: Reported on 10/04/2016 06/15/16   Henreitta Leber, MD  famotidine (PEPCID) 20 MG tablet Take 20 mg by mouth daily. 03/12/17   [provider]  hydrALAZINE (APRESOLINE) 25 MG tablet Take 25 mg by mouth 3 (three) times daily. 01/12/17   [provider]  insulin NPH Human (HUMULIN N,NOVOLIN N) 100 UNIT/ML injection Inject 3 Units into the skin 2 (two) times daily with a meal. 09/23/16   [provider]  levETIRAcetam (KEPPRA) 500 MG tablet Take 500 mg by mouth 3 (three) times a week. Tues,thurs,saturday    [provider]  Melatonin 3 MG TABS Take 1 tablet by mouth daily.    [provider]  NOVOLOG MIX 70/30 (70-30) 100 UNIT/ML injection Inject 0.08 mLs (8 Units total) into the skin 2 (two) times daily with a meal. Patient not taking: Reported on 10/04/2016 03/16/16   Loletha Grayer, MD  ondansetron (ZOFRAN) 4 MG tablet Take 1 tablet (4 mg total) by mouth daily as needed. 10/04/16   Orbie Pyo, MD  pantoprazole (PROTONIX) 40 MG tablet Take 40 mg by mouth daily.    [provider]  polyethylene glycol (MIRALAX / GLYCOLAX) packet Take 17 g by mouth daily.    [provider]  RENVELA 800 MG tablet Take 1 tablet by mouth 3 (three) times daily. 01/15/17   [provider]  senna (SENOKOT) 8.6 MG TABS tablet Take 1 tablet by mouth at bedtime.    [provider]  simvastatin (ZOCOR) 20 MG  tablet Take 1 tablet by mouth daily. 02/06/17   [provider]    REVIEW OF SYSTEMS:  Review of Systems  Constitutional: Negative for chills, fever, malaise/fatigue and weight loss.  HENT: Negative for ear pain, hearing loss and tinnitus.   Eyes: Negative for blurred vision, double vision, pain and redness.  Respiratory: Negative for cough, hemoptysis and shortness of breath.   Cardiovascular: Negative for chest pain, palpitations, orthopnea and leg swelling.  Gastrointestinal: Negative for abdominal pain, constipation, diarrhea, nausea and vomiting.  Genitourinary: Negative for dysuria, frequency and hematuria.  Musculoskeletal: Positive for joint pain (right knee). Negative for back pain and neck pain.  Skin:       No acne, rash, or lesions  Neurological: Negative for dizziness, tremors, focal weakness and weakness.  Endo/Heme/Allergies: Negative for polydipsia. Does not bruise/bleed easily.  Psychiatric/Behavioral: Negative for depression. The patient is not nervous/anxious and does not have insomnia.      VITAL SIGNS:   Vitals:   09/15/18 1912 09/15/18 1913 09/15/18 2258  BP:  130/77 121/76  Pulse:  (!) 118 (!) 109  Resp:  20 18  Temp:  99.9 F (37.7 C) 99.1 F (37.3 C)  TempSrc:  Oral Oral  SpO2:  97% 94%  Weight: 112.9 kg    Height: 6\' 2"  (1.88 m)     Wt Readings from Last 3 Encounters:  09/15/18 112.9 kg  04/13/17 112.7 kg  10/04/16 104.6 kg    PHYSICAL EXAMINATION:  Physical Exam  Vitals reviewed. Constitutional: He is oriented to person, place, and time. He appears well-developed and well-nourished. No distress.  HENT:  Head: Normocephalic and atraumatic.  Mouth/Throat: Oropharynx is clear and moist.  Eyes: Pupils are equal, round, and reactive to light. Conjunctivae and EOM are normal. No scleral icterus.  Neck: Normal range of motion. Neck supple. No JVD present. No thyromegaly present.  Cardiovascular: Normal rate, regular rhythm and intact  distal pulses. Exam reveals no gallop and no friction rub.  No murmur heard. Respiratory: Effort normal and breath sounds normal. No respiratory distress. He has no wheezes. He has no rales.  GI: Soft. Bowel sounds are normal. He exhibits no distension. There is no tenderness.  Musculoskeletal: Normal range of motion. He exhibits tenderness (right knee pain, warm to touch). He exhibits no edema.  No arthritis, no gout  Lymphadenopathy:    He has no cervical adenopathy.  Neurological: He is alert and oriented to person, place, and time. No cranial nerve deficit.  No dysarthria, no aphasia  Skin: Skin is warm and dry. No rash noted. No erythema.  Psychiatric: He has a normal mood and affect. His behavior  is normal. Judgment and thought content normal.    LABORATORY PANEL:   CBC Recent Labs  Lab 09/15/18 2220  WBC 14.0*  HGB 12.1*  HCT 38.9*  PLT 189   ------------------------------------------------------------------------------------------------------------------  Chemistries  Recent Labs  Lab 09/15/18 2220  NA 144  K 4.7  CL 113*  CO2 19*  GLUCOSE 130*  BUN 71*  CREATININE 6.23*  CALCIUM 9.5   ------------------------------------------------------------------------------------------------------------------  Cardiac Enzymes No results for input(s): TROPONINI in the last 168 hours. ------------------------------------------------------------------------------------------------------------------  RADIOLOGY:  Dg Hip Unilat W Or Wo Pelvis 2-3 Views Right  Result Date: 09/15/2018 CLINICAL DATA:  Right hip pain.  No known injury. EXAM: DG HIP (WITH OR WITHOUT PELVIS) 2-3V RIGHT COMPARISON:  None. FINDINGS: There is no evidence of hip fracture or dislocation. There is no evidence of arthropathy or other focal bone abnormality. IMPRESSION: Negative. Electronically Signed   By: Rolm Baptise M.D.   On: 09/15/2018 22:41    EKG:   Orders placed or performed during the  hospital encounter of 04/11/17  . ED EKG 12-Lead  . ED EKG 12-Lead  . EKG 12-Lead  . EKG 12-Lead  . EKG    IMPRESSION AND PLAN:  Principal Problem:   Acute on chronic renal failure (HCC) -his increase in creatinine does not represent a significant drop in his filtration rate, though he has end-stage renal disease and has not been initiated on dialysis.  He follows with a nephrologist here in [place] per his report and states that the conversation surrounding dialysis has been postponed for now due to the fact that he still makes good urine.  We will get a nephrology consult for further recommendation Active Problems:   Gout -colchicine given in the ED, repeat dose can be given, might consider steroid afterwards if his gout is not improving enough   Type II diabetes mellitus with manifestations (HCC) -carb modified diet with sliding scale insulin coverage   Hypertension -currently normotensive, hold home antihypertensives for now, can reorder these if his blood pressure rises significantly   Hyperlipemia -Home dose antilipid   GERD (gastroesophageal reflux disease) -home dose PPI  Chart review performed and case discussed with ED provider. Labs, imaging and/or ECG reviewed by provider and discussed with patient/family. Management plans discussed with the patient and/or family.  DVT PROPHYLAXIS: SubQ heparin  GI PROPHYLAXIS:  PPI   ADMISSION STATUS: Observation  CODE STATUS: Full Code Status History    Date Active Date Inactive Code Status Order ID Comments User Context   04/11/2017 2253 04/13/2017 1606 Full Code [id number]  Lance Coon, MD Inpatient   06/12/2016 2355 06/15/2016 1604 Full Code [id number]  Harvie Bridge, DO Inpatient   03/13/2016 7078 03/16/2016 1411 Full Code [id number]  Idelle Crouch, MD Inpatient      TOTAL TIME TAKING CARE OF THIS PATIENT: 40 minutes.   Akiba Melfi FIELDING 09/16/2018, 3:06 AM  [facility]  Office   [phone]  CC: Primary care physician; [facility]  Note:  This document was prepared using Dragon voice recognition software and may include unintentional dictation errors.

## 2018-09-16 NOTE — Progress Notes (Signed)
Patient seen and evaluated.  Agree with MD admission plan however I will go ahead and order orthopedic surgery consultation as patient may benefit from intramuscular injection of steroids for gout.  Will order uric acid and oral prednisone for now.  Along with Norco for better pain control.

## 2018-09-16 NOTE — Progress Notes (Signed)
[facility]  ROUNDING NOTE   Subjective:   [name] admitted to [facility] on 09/15/2018 for Acute renal failure, unspecified acute renal failure type [facility]) [N17.9]  Patient followed by [facility]. Was last seen on 11/7 where he was asked to start hemodialysis. He refused at that time.   Now with uremic symptoms. Lethargic. States he still does not want dialysis.   Complains of cramping in lower extremity edema.   Objective:  Vital signs in last 24 hours:  Temp:  [98.6 F (37 C)-101.9 F (38.8 C)] 101.9 F (38.8 C) (11/18 1221) Pulse Rate:  [86-118] 110 (11/18 1221) Resp:  [17-24] 17 (11/18 1221) BP: (120-150)/(66-92) 146/92 (11/18 1221) SpO2:  [94 %-99 %] 95 % (11/18 1221) Weight:  [112.9 kg-114.4 kg] 114.4 kg (11/18 0345)  Weight change:  Filed Weights   09/15/18 1912 09/16/18 0345  Weight: 112.9 kg 114.4 kg    Intake/Output: I/O last 3 completed shifts: In: -  Out: 375 [Urine:375]   Intake/Output this shift:  Total I/O In: 360 [P.O.:360] Out: 300 [Urine:300]  Physical Exam: General: NAD,   Head: Normocephalic, atraumatic. Moist oral mucosal membranes  Eyes: Anicteric, PERRL  Neck: Supple, trachea midline  Lungs:  Clear to auscultation  Heart: Regular rate and rhythm  Abdomen:  Soft, nontender,   Extremities: + peripheral edema.  Neurologic: lethargic  Skin: No lesions  Access: none    Basic Metabolic Panel: Recent Labs  Lab 09/15/18 2220 09/16/18 0546 09/16/18 1153  NA 144 144  --   K 4.7 4.5  --   CL 113* 113*  --   CO2 19* 21*  --   GLUCOSE 130* 127*  --   BUN 71* 74*  --   CREATININE 6.23* 6.07*  --   CALCIUM 9.5 9.1  --   PHOS  --   --  3.3    Liver Function Tests: No results for input(s): AST, ALT, ALKPHOS, BILITOT, PROT, ALBUMIN in the last 168 hours. No results for input(s): LIPASE, AMYLASE in the last 168 hours. No results for input(s): AMMONIA in the last 168 hours.  CBC: Recent Labs  Lab 09/15/18 2220  09/16/18 0546  WBC 14.0* 13.5*  NEUTROABS 10.2*  --   HGB 12.1* 11.1*  HCT 38.9* 35.5*  MCV 79.2* 79.2*  PLT 189 200    Cardiac Enzymes: No results for input(s): CKTOTAL, CKMB, CKMBINDEX, TROPONINI in the last 168 hours.  BNP: Invalid input(s): POCBNP  CBG: Recent Labs  Lab 09/15/18 2253 09/16/18 0754 09/16/18 1151  GLUCAP 120* 39 116*    Microbiology: Results for orders placed or performed during the hospital encounter of 04/11/17  Blood Culture (routine x 2)     Status: None   Collection Time: 04/11/17  5:49 PM  Result Value Ref Range Status   Specimen Description BLOOD LAC  Final   Special Requests   Final    BOTTLES DRAWN AEROBIC AND ANAEROBIC Blood Culture adequate volume   Culture NO GROWTH 5 DAYS  Final   Report Status 04/16/2017 FINAL  Final  Blood Culture (routine x 2)     Status: None   Collection Time: 04/11/17  5:49 PM  Result Value Ref Range Status   Specimen Description BLOOD RFOA  Final   Special Requests   Final    BOTTLES DRAWN AEROBIC AND ANAEROBIC Blood Culture adequate volume   Culture NO GROWTH 5 DAYS  Final   Report Status 04/16/2017 FINAL  Final  Urine culture  Status: Abnormal   Collection Time: 04/11/17  8:23 PM  Result Value Ref Range Status   Specimen Description URINE, CLEAN CATCH  Final   Special Requests NONE  Final   Culture MULTIPLE SPECIES PRESENT, SUGGEST RECOLLECTION (A)  Final   Report Status 04/13/2017 FINAL  Final    Coagulation Studies: No results for input(s): LABPROT, INR in the last 72 hours.  Urinalysis: Recent Labs    09/16/18 1224  COLORURINE YELLOW*  LABSPEC 1.010  PHURINE 5.0  GLUCOSEU NEGATIVE  HGBUR MODERATE*  BILIRUBINUR NEGATIVE  KETONESUR NEGATIVE  PROTEINUR 100*  NITRITE NEGATIVE  LEUKOCYTESUR LARGE*      Imaging: Dg Hip Unilat W Or Wo Pelvis 2-3 Views Right  Result Date: 09/15/2018 CLINICAL DATA:  Right hip pain.  No known injury. EXAM: DG HIP (WITH OR WITHOUT PELVIS) 2-3V RIGHT  COMPARISON:  None. FINDINGS: There is no evidence of hip fracture or dislocation. There is no evidence of arthropathy or other focal bone abnormality. IMPRESSION: Negative. Electronically Signed   By: Rolm Baptise M.D.   On: 09/15/2018 22:41     Medications:    . amLODipine  10 mg Oral Daily  . aspirin EC  81 mg Oral Daily  . atorvastatin  80 mg Oral Daily  . carvedilol  12.5 mg Oral BID  . docusate sodium  100 mg Oral BID  . heparin  5,000 Units Subcutaneous Q8H  . hydrALAZINE  25 mg Oral TID  . insulin aspart  0-5 Units Subcutaneous QHS  . insulin aspart  0-9 Units Subcutaneous TID WC  . insulin detemir  3 Units Subcutaneous BID  . [START ON 09/17/2018] levETIRAcetam  500 mg Oral Once per day on Tue Thu Sat  . Melatonin  2.5 mg Oral QHS  . pantoprazole  40 mg Oral Daily  . predniSONE  50 mg Oral Q breakfast  . sevelamer carbonate  800 mg Oral TID WC   acetaminophen **OR** acetaminophen, hydrALAZINE, ondansetron **OR** ondansetron (ZOFRAN) IV, oxyCODONE  Assessment/ Plan:  [name] is a 57 y.o. black male with diabetes mellitus type II, hypertension, seizure disorder, hyperlipidemia, history of subarachnoid hemorrhage  1. Acute renal failure on chronic kidney disease stage V: with metabolic acidosis. Baseline creatinine of 5.09, GFR of 13 on 06/12/18.  Chronic kidney disease secondary to diabetic nephropathy Patient most likely has progression of chronic kidney disease and now with symptoms.  Recommend initiating hemodialysis. Consult vascular Patient currently refusing but this is medically necessary at this time.   2. Hypertension: elevated - amlodipine, carvedilol and hydralazine.   3. Diabetes mellitus type II with chronic kidney disease: hemoglobin A1c of 5.9% on 06/12/18.   4. Anemia of chronic kidney disease: hemoglobin 11.1 - no indication to start ESA therapy  5. Secondary Hyperparathyroidism: has been prescribed sevelamer in the past. Calcium and phosphorus  at goal.  PTH on 06/12/18 elevated at 303.8.    LOS: 0 Caleb Hughes 11/18/20191:33 PM

## 2018-09-16 NOTE — Evaluation (Signed)
Physical Therapy Evaluation Patient Details Name: Caleb Hughes MRN: [id number] DOB: 1961/03/13 Today's Date: 09/16/2018   History of Present Illness  Pt is a 57 y/o M who presented with BLE pain (R worse than L).  Pt reports h/o gout in R knee previously.  Pending ortho surgery consult for potential steroid injection for potential gout.  Pt noted to be in acute on chronic renal failure.  Pt's PMH includes morbid obesity.      Clinical Impression  Pt admitted with above diagnosis. Pt currently with functional limitations due to the deficits listed below (see PT Problem List). Caleb Hughes limited by pain and dizziness this session.  He requires assist with bed mobility due to pain.  Sitting EOB pt reports 8/10 dizziness so it was not safe to attempt OOB activity this session. Vitals monitored throughout session (see general comments below).  Given pt's current mobility status, recommending SNF at d/c.   Pt will benefit from skilled PT to increase their independence and safety with mobility to allow discharge to the venue listed below.      Follow Up Recommendations SNF    Equipment Recommendations  Other (comment)(TBD at pt progresses, may need RW)    Recommendations for Other Services       Precautions / Restrictions Precautions Precautions: Fall;Other (comment) Precaution Comments: monitor HR Restrictions Weight Bearing Restrictions: No      Mobility  Bed Mobility Overal bed mobility: Needs Assistance Bed Mobility: Supine to Sit;Sit to Supine     Supine to sit: Min assist;HOB elevated Sit to supine: Mod assist   General bed mobility comments: Assist to advance RLE to EOB.  Pt relies heavily on bed rail with HOB elevated.  Increased time with rest breaks due to pain. Pt requires mod assist to bring LEs back into bed for sit>supine.   Transfers                 General transfer comment: Not safe to attempt at this time as pt reports 8/10 dizziness sitting EOB that does  not change with time.    Ambulation/Gait                Stairs            Wheelchair Mobility    Modified Rankin (Stroke Patients Only)       Balance Overall balance assessment: Needs assistance Sitting-balance support: No upper extremity supported;Feet supported Sitting balance-Leahy Scale: Good                                       Pertinent Vitals/Pain Pain Assessment: 0-10 Pain Score: 7  Pain Location: R knee and foot, L foot Pain Descriptors / Indicators: Aching;Grimacing;Guarding;Moaning Pain Intervention(s): Limited activity within patient's tolerance;Monitored during session;Utilized relaxation techniques    Home Living Family/patient expects to be discharged to:: Private residence Living Arrangements: Children(son works during the day at [employer]) Available Help at Discharge: Family;Available 24 hours/day Type of Home: Apartment Home Access: Elevator     Home Layout: One level Home Equipment: Cane - single point;Walker - 4 wheels;Bedside commode;Shower seat;Hand held shower head;Grab bars - tub/shower;Toilet riser;Wheelchair - manual      Prior Function Level of Independence: Independent with assistive device(s)         Comments: Pt uses WC inside home which he operates ind.  1 fall in the past 6 months.  Pt uses SPC in  community.  Ind with cooking, bathing, dressing, cleaning.      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  Functionally limited due to pain.  LLE Deficits / Details: Did not formally assess as pt has reported recent LLE pain (partially reason for admission) and with L foot pain.      Cervical  / Trunk Assessment Cervical / Trunk Assessment: Normal  Communication   Communication: No difficulties  Cognition Arousal/Alertness: Awake/alert Behavior During Therapy: Flat affect(but pleasant) Overall Cognitive Status: Within Functional Limits for tasks assessed                                        General Comments General comments (skin integrity, edema, etc.): Pt reports 8/10 dizziness sitting EOB.  Dizziness /10 lying supine at end of session.  Vitals monitored throughout session. In supine at start of session: BP 120/80, SpO2 93%, pulse 105.  Sitting EOB: 125/86, SpO2 91%, pulse 106.  Supine: BP 104/94, SpO2 93%, pulse 103.  Taken again in supine: BP 115/78, SpO2 93%, pulse 101.      Exercises General Exercises - Lower Extremity Ankle Circles/Pumps: AROM;Both;10 reps;Seated Hip Flexion/Marching: 10 reps;Seated;Both Heel Raises: AROM;Both;10 reps;Seated   Assessment/Plan    PT Assessment Patient needs continued PT services  PT Problem List Decreased strength;Decreased range of motion;Decreased activity tolerance;Decreased balance;Decreased mobility;Decreased knowledge of use of DME;Decreased safety awareness;Pain       PT Treatment Interventions DME instruction;Gait training;Functional mobility training;Therapeutic activities;Therapeutic exercise;Balance training;Neuromuscular re-education;Patient/family education;Modalities    PT Goals (Current goals can be found in the Care Plan section)  Acute Rehab PT Goals Patient Stated Goal: decreased pain PT Goal Formulation: With patient Time For Goal Achievement: 09/30/18 Potential to Achieve Goals: Good    Frequency Min 2X/week   Barriers to discharge Decreased caregiver support Alone during the day    Co-evaluation               AM-PAC PT "6 Clicks" Daily Activity  Outcome Measure Difficulty turning over in bed (including adjusting bedclothes, sheets and blankets)?: Unable Difficulty moving from  lying on back to sitting on the side of the bed? : Unable Difficulty sitting down on and standing up from a chair with arms (e.g., wheelchair, bedside commode, etc,.)?: Unable Help needed moving to and from a bed to chair (including a wheelchair)?: Total Help needed walking in hospital room?: Total Help needed climbing 3-5 steps with a railing? : Total 6 Click Score: 6    End of Session   Activity Tolerance: Other (comment)(limited by 8/10 dizziness) Patient left: in bed;with call bell/phone within reach;with bed alarm set Nurse Communication: Mobility status;Other (comment)(pt's reported dizziness) PT Visit Diagnosis: Pain;Unsteadiness on feet (R26.81);Difficulty in walking, not elsewhere classified (R26.2) Pain - Right/Left: Right Pain - part of body: Knee    Time: 1093-2355 PT Time Calculation (min) (ACUTE ONLY): 33 min   Charges:   PT Evaluation $PT Eval Moderate Complexity: 1 Mod PT Treatments $Therapeutic Activity: 8-22 mins        Session was performed by student PT, Belva Crome, and directed, overseen, and documented by this  PT.  Collie Siad PT, DPT  09/16/2018, 4:42 PM

## 2018-09-16 NOTE — Progress Notes (Signed)
Family Meeting Note  Advance Directive:no  Today a meeting took place with the Patient. The following clinical team members were present during this meeting:MD  The following were discussed:Patient's diagnosis:gout AKI on CKD , Patient's progosis: > 12 months and Goals for treatment: Full Code  Additional follow-up to be provided: chaplain consult for advanced directives  Time spent during discussion: 16 minutes  Caleb Messer, MD

## 2018-09-17 DIAGNOSIS — K219 Gastro-esophageal reflux disease without esophagitis: Secondary | ICD-10-CM | POA: Diagnosis present

## 2018-09-17 DIAGNOSIS — D631 Anemia in chronic kidney disease: Secondary | ICD-10-CM | POA: Diagnosis present

## 2018-09-17 DIAGNOSIS — E1122 Type 2 diabetes mellitus with diabetic chronic kidney disease: Secondary | ICD-10-CM | POA: Diagnosis present

## 2018-09-17 DIAGNOSIS — I120 Hypertensive chronic kidney disease with stage 5 chronic kidney disease or end stage renal disease: Secondary | ICD-10-CM | POA: Diagnosis present

## 2018-09-17 DIAGNOSIS — Z794 Long term (current) use of insulin: Secondary | ICD-10-CM | POA: Diagnosis not present

## 2018-09-17 DIAGNOSIS — M79604 Pain in right leg: Secondary | ICD-10-CM | POA: Diagnosis present

## 2018-09-17 DIAGNOSIS — M79605 Pain in left leg: Secondary | ICD-10-CM | POA: Diagnosis present

## 2018-09-17 DIAGNOSIS — Y92009 Unspecified place in unspecified non-institutional (private) residence as the place of occurrence of the external cause: Secondary | ICD-10-CM | POA: Diagnosis not present

## 2018-09-17 DIAGNOSIS — Z86010 Personal history of colonic polyps: Secondary | ICD-10-CM | POA: Diagnosis not present

## 2018-09-17 DIAGNOSIS — Z9115 Patient's noncompliance with renal dialysis: Secondary | ICD-10-CM | POA: Diagnosis not present

## 2018-09-17 DIAGNOSIS — E872 Acidosis: Secondary | ICD-10-CM | POA: Diagnosis present

## 2018-09-17 DIAGNOSIS — N186 End stage renal disease: Secondary | ICD-10-CM | POA: Diagnosis present

## 2018-09-17 DIAGNOSIS — E1140 Type 2 diabetes mellitus with diabetic neuropathy, unspecified: Secondary | ICD-10-CM | POA: Diagnosis present

## 2018-09-17 DIAGNOSIS — N2581 Secondary hyperparathyroidism of renal origin: Secondary | ICD-10-CM | POA: Diagnosis present

## 2018-09-17 DIAGNOSIS — W07XXXA Fall from chair, initial encounter: Secondary | ICD-10-CM | POA: Diagnosis present

## 2018-09-17 DIAGNOSIS — Z9119 Patient's noncompliance with other medical treatment and regimen: Secondary | ICD-10-CM | POA: Diagnosis not present

## 2018-09-17 DIAGNOSIS — G40909 Epilepsy, unspecified, not intractable, without status epilepticus: Secondary | ICD-10-CM | POA: Diagnosis present

## 2018-09-17 DIAGNOSIS — E785 Hyperlipidemia, unspecified: Secondary | ICD-10-CM | POA: Diagnosis present

## 2018-09-17 DIAGNOSIS — Z79899 Other long term (current) drug therapy: Secondary | ICD-10-CM | POA: Diagnosis not present

## 2018-09-17 DIAGNOSIS — Z841 Family history of disorders of kidney and ureter: Secondary | ICD-10-CM | POA: Diagnosis not present

## 2018-09-17 DIAGNOSIS — Z8249 Family history of ischemic heart disease and other diseases of the circulatory system: Secondary | ICD-10-CM | POA: Diagnosis not present

## 2018-09-17 DIAGNOSIS — M109 Gout, unspecified: Secondary | ICD-10-CM | POA: Diagnosis present

## 2018-09-17 DIAGNOSIS — N179 Acute kidney failure, unspecified: Secondary | ICD-10-CM | POA: Diagnosis present

## 2018-09-17 LAB — PROTEIN ELECTROPHORESIS, SERUM
A/G Ratio: 0.9 (ref 0.7–1.7)
ALBUMIN ELP: 3.2 g/dL (ref 2.9–4.4)
Alpha-1-Globulin: 0.5 g/dL — ABNORMAL HIGH (ref 0.0–0.4)
Alpha-2-Globulin: 1 g/dL (ref 0.4–1.0)
Beta Globulin: 0.9 g/dL (ref 0.7–1.3)
GLOBULIN, TOTAL: 3.7 g/dL (ref 2.2–3.9)
Gamma Globulin: 1.3 g/dL (ref 0.4–1.8)
M-Spike, %: 0.3 g/dL — ABNORMAL HIGH
Total Protein ELP: 6.9 g/dL (ref 6.0–8.5)

## 2018-09-17 LAB — BASIC METABOLIC PANEL
Anion gap: 11 (ref 5–15)
BUN: 80 mg/dL — AB (ref 6–20)
CALCIUM: 9.2 mg/dL (ref 8.9–10.3)
CO2: 19 mmol/L — ABNORMAL LOW (ref 22–32)
Chloride: 112 mmol/L — ABNORMAL HIGH (ref 98–111)
Creatinine, Ser: 6.35 mg/dL — ABNORMAL HIGH (ref 0.61–1.24)
GFR calc Af Amer: 10 mL/min — ABNORMAL LOW (ref >60)
GFR calc non Af Amer: 9 mL/min — ABNORMAL LOW (ref >60)
GLUCOSE: 154 mg/dL — AB (ref 70–99)
Potassium: 4.8 mmol/L (ref 3.5–5.1)
SODIUM: 142 mmol/L (ref 135–145)

## 2018-09-17 LAB — PARATHYROID HORMONE, INTACT (NO CA): PTH: 183 pg/mL — AB (ref 15–65)

## 2018-09-17 LAB — HEPATITIS B SURFACE ANTIGEN: HEP B S AG: NEGATIVE

## 2018-09-17 LAB — PROTEIN ELECTRO, RANDOM URINE
Albumin ELP, Urine: 42.1 %
Alpha-1-Globulin, U: 6.4 %
Alpha-2-Globulin, U: 14.4 %
Beta Globulin, U: 18.3 %
Gamma Globulin, U: 18.7 %
M SPIKE UR: 4.7 % — AB
TOTAL PROTEIN, URINE-UPE24: 141.3 mg/dL

## 2018-09-17 LAB — GLUCOSE, CAPILLARY
GLUCOSE-CAPILLARY: 157 mg/dL — AB (ref 70–99)
GLUCOSE-CAPILLARY: 158 mg/dL — AB (ref 70–99)
Glucose-Capillary: 130 mg/dL — ABNORMAL HIGH (ref 70–99)
Glucose-Capillary: 137 mg/dL — ABNORMAL HIGH (ref 70–99)
Glucose-Capillary: 182 mg/dL — ABNORMAL HIGH (ref 70–99)

## 2018-09-17 LAB — HIV ANTIBODY (ROUTINE TESTING W REFLEX): HIV SCREEN 4TH GENERATION: NONREACTIVE

## 2018-09-17 LAB — KAPPA/LAMBDA LIGHT CHAINS
Kappa free light chain: 150.6 mg/L — ABNORMAL HIGH (ref 3.3–19.4)
Kappa, lambda light chain ratio: 1.64 (ref 0.26–1.65)
LAMDA FREE LIGHT CHAINS: 92.1 mg/L — AB (ref 5.7–26.3)

## 2018-09-17 LAB — HEPATITIS B CORE ANTIBODY, IGM: Hep B C IgM: NEGATIVE

## 2018-09-17 LAB — HEPATITIS B SURFACE ANTIBODY,QUALITATIVE: Hep B S Ab: NONREACTIVE

## 2018-09-17 LAB — HEPATITIS C ANTIBODY

## 2018-09-17 MED ORDER — SODIUM CHLORIDE 0.9 % IV SOLN
INTRAVENOUS | Status: DC | PRN
  Administered 2018-09-17: 250 mL via INTRAVENOUS

## 2018-09-17 NOTE — Care Management (Signed)
Patient admitted for Acute on chronic renal failure.  Patient declining outpatient HD to be arranged.  Patient states that he lives at home with his son, however he works during the day.  Patient states that his sister lives locally for support at transportation.  PCP [employer].  Patient states that he utilizes [employer] for transportation to appointments. Pharmacy = [facility].  Denies issues obtaining medications.    PT has assessed patient and recommends SNF.  Patient currently declining SNF placement.  Patient is agreeable to home health services.  Patient states that he has used [facility] in the past, and would like to use them again.  Heads up referral made to [facility] with [facility].  Patient states that he has a cane, RW, BSC, shower seat, and WC in the home.  RNCM following.

## 2018-09-17 NOTE — Progress Notes (Signed)
Physical Therapy Treatment Patient Details Name: Caleb Hughes MRN: [id number] DOB: 06-May-1961 Today's Date: 09/17/2018    History of Present Illness Pt is a 57 y/o M who presented with BLE pain (R worse than L).  Pt reports h/o gout in R knee previously.  Pending ortho surgery consult for potential steroid injection for potential gout.  Pt noted to be in acute on chronic renal failure.  Pt's PMH includes morbid obesity.     PT Comments    Caleb Hughes made progress with mobility this session but was unable to ambulate due to reported 9/10 dizziness in standing.  Pt performed supine<>sit with supervision for safety and stood with min assist which is an improvement compared to yesterday.  BP 116/74, pulse 74, SpO2 93%.  Sitting: BP 115/88, SpO2 93%, pulse 79.  In standing: pulse 81, SpO2 96%.  Attempted to take BP in standing but unable to get reading from BP machine and pt reported 9/10 dizzines and R knee pain and was instructed to sit EOB.  In sitting: BP 133/119, SpO2 97%, pulse 78.  Cuff readjusted and BP taken again in sitting: 105/70.  Pt reporting 8/10 dizziness after sitting EOB for ~4 minutes, thus was instructed to return to supine.  In supine: 108/66 BP, pulse 70, SpO2 93%.  SNF remains most appropriate d/c plan at this time.   Follow Up Recommendations  SNF(HHPT with OOB assist if pt refuses SNF recommendation)     Equipment Recommendations  Rolling walker with 5" wheels    Recommendations for Other Services       Precautions / Restrictions Precautions Precautions: Fall;Other (comment) Precaution Comments: monitor HR Restrictions Weight Bearing Restrictions: No    Mobility  Bed Mobility Overal bed mobility: Needs Assistance Bed Mobility: Supine to Sit;Sit to Supine     Supine to sit: HOB elevated;Supervision Sit to supine: Supervision   General bed mobility comments: HOB elevated but pt does not require physical assist or cues.  Supervision for safety.   Pt denies  dizziness sitting EOB today.  Transfers Overall transfer level: Needs assistance Equipment used: Rolling walker (2 wheeled) Transfers: Sit to/from Stand Sit to Stand: Min assist         General transfer comment: Pt slow to stand due to pain in R knee.  Requires min assist to remain steady and to boost to standing. Pt reports dizziness and R knee pain limiting standing time.  Cues for proper hand placement to sit.   Ambulation/Gait             General Gait Details: Deferred as pt reporting 9/10 dizziness in standing   Stairs             Wheelchair Mobility    Modified Rankin (Stroke Patients Only)       Balance Overall balance assessment: Needs assistance Sitting-balance support: No upper extremity supported;Feet supported Sitting balance-Leahy Scale: Good     Standing balance support: Bilateral upper extremity supported;During functional activity Standing balance-Leahy Scale: Poor Standing balance comment: Pt relies on BUE support for static standing                            Cognition Arousal/Alertness: Awake/alert Behavior During Therapy: Flat affect(but pleasant) Overall Cognitive Status: Within Functional Limits for tasks assessed  Exercises General Exercises - Lower Extremity Ankle Circles/Pumps: AROM;Both;10 reps;Supine Straight Leg Raises: Both;10 reps;Supine;Strengthening    General Comments General comments (skin integrity, edema, etc.): In supine: BP 116/74, pulse 74, SpO2 93%.  Sitting: BP 115/88, SpO2 93%, pulse 79.  In standing: pulse 81, SpO2 96%.  Attempted to take BP in standing but unable to get reading from BP machine and pt reported 9/10 dizzines and R knee pain and was instructed to sit EOB.  In sitting: BP 133/119, SpO2 97%, pulse 78.  Cuff readjusted and BP taken again in sitting: 105/70.  Pt reporting 8/10 dizziness after sitting EOB for ~4 minutes, thus was  instructed to return to supine.  In supine: 108/66 BP, pulse 70, SpO2 93%.      Pertinent Vitals/Pain Pain Assessment: 0-10 Pain Score: 6  Pain Location: R knee and plantar surfaces of Bil feet Pain Descriptors / Indicators: Aching;Grimacing;Guarding Pain Intervention(s): Limited activity within patient's tolerance;Monitored during session;Utilized relaxation techniques    Home Living                      Prior Function            PT Goals (current goals can now be found in the care plan section) Acute Rehab PT Goals Patient Stated Goal: decreased pain PT Goal Formulation: With patient Time For Goal Achievement: 09/30/18 Potential to Achieve Goals: Good Progress towards PT goals: Progressing toward goals(modestly, limited by dizziness)    Frequency    Min 2X/week      PT Plan Current plan remains appropriate    Co-evaluation              AM-PAC PT "6 Clicks" Daily Activity  Outcome Measure  Difficulty turning over in bed (including adjusting bedclothes, sheets and blankets)?: A Little Difficulty moving from lying on back to sitting on the side of the bed? : A Little Difficulty sitting down on and standing up from a chair with arms (e.g., wheelchair, bedside commode, etc,.)?: Unable Help needed moving to and from a bed to chair (including a wheelchair)?: A Lot Help needed walking in hospital room?: A Lot Help needed climbing 3-5 steps with a railing? : Total 6 Click Score: 12    End of Session Equipment Utilized During Treatment: Gait belt Activity Tolerance: Other (comment)(limited by 9/10 dizziness) Patient left: in bed;with call bell/phone within reach;with bed alarm set Nurse Communication: Mobility status;Other (comment)(pt's reported dizziness; vitals) PT Visit Diagnosis: Pain;Unsteadiness on feet (R26.81);Difficulty in walking, not elsewhere classified (R26.2) Pain - Right/Left: Right Pain - part of body: Knee     Time: 2563-8937 PT Time  Calculation (min) (ACUTE ONLY): 28 min  Charges:  $Therapeutic Activity: 23-37 mins                     Session was performed by student PT, Belva Crome, and directed, overseen, and documented by this PT.  Collie Siad PT, DPT  09/17/2018, 4:29 PM

## 2018-09-17 NOTE — Progress Notes (Signed)
[facility]  ROUNDING NOTE   Subjective:   Patient seated in chair. Patient denies any symptoms.   Patient does not want to do dialysis at this time. He does not feel that he needs dialysis right now.   Right and left arthrocentesis yesterday. 27K wbcs. Empiric ceftriaxone.   Tmax 101.9  Objective:  Vital signs in last 24 hours:  Temp:  [98.1 F (36.7 C)-98.5 F (36.9 C)] 98.5 F (36.9 C) (11/19 1211) Pulse Rate:  [77-105] 80 (11/19 1211) Resp:  [16-18] 16 (11/19 1211) BP: (116-121)/(66-82) 119/68 (11/19 1211) SpO2:  [91 %-96 %] 96 % (11/19 1211) Weight:  [111.9 kg] 111.9 kg (11/19 0441)  Weight change: -1.046 kg Filed Weights   09/15/18 1912 09/16/18 0345 09/17/18 0441  Weight: 112.9 kg 114.4 kg 111.9 kg    Intake/Output: I/O last 3 completed shifts: In: 960 [P.O.:760; IV Piggyback:200] Out: 1175 [Urine:1175]   Intake/Output this shift:  Total I/O In: 120 [P.O.:120] Out: -   Physical Exam: General: NAD,   Head: Normocephalic, atraumatic. Moist oral mucosal membranes  Eyes: Anicteric, PERRL  Neck: Supple, trachea midline  Lungs:  Clear to auscultation  Heart: Regular rate and rhythm  Abdomen:  Soft, nontender,   Extremities: + peripheral edema.  Neurologic: lethargic  Skin: No lesions  Access: none    Basic Metabolic Panel: Recent Labs  Lab 09/15/18 2220 09/16/18 0546 09/16/18 1153 09/17/18 0358  NA 144 144  --  142  K 4.7 4.5  --  4.8  CL 113* 113*  --  112*  CO2 19* 21*  --  19*  GLUCOSE 130* 127*  --  154*  BUN 71* 74*  --  80*  CREATININE 6.23* 6.07*  --  6.35*  CALCIUM 9.5 9.1  --  9.2  PHOS  --   --  3.3  --     Liver Function Tests: No results for input(s): AST, ALT, ALKPHOS, BILITOT, PROT, ALBUMIN in the last 168 hours. No results for input(s): LIPASE, AMYLASE in the last 168 hours. No results for input(s): AMMONIA in the last 168 hours.  CBC: Recent Labs  Lab 09/15/18 2220 09/16/18 0546  WBC 14.0* 13.5*   NEUTROABS 10.2*  --   HGB 12.1* 11.1*  HCT 38.9* 35.5*  MCV 79.2* 79.2*  PLT 189 200    Cardiac Enzymes: No results for input(s): CKTOTAL, CKMB, CKMBINDEX, TROPONINI in the last 168 hours.  BNP: Invalid input(s): POCBNP  CBG: Recent Labs  Lab 09/16/18 1934 09/16/18 2136 09/17/18 0736 09/17/18 0758 09/17/18 1206  GLUCAP 128* 138* 130* 61* 157*    Microbiology: Results for orders placed or performed during the hospital encounter of 04/11/17  Blood Culture (routine x 2)     Status: None   Collection Time: 04/11/17  5:49 PM  Result Value Ref Range Status   Specimen Description BLOOD LAC  Final   Special Requests   Final    BOTTLES DRAWN AEROBIC AND ANAEROBIC Blood Culture adequate volume   Culture NO GROWTH 5 DAYS  Final   Report Status 04/16/2017 FINAL  Final  Blood Culture (routine x 2)     Status: None   Collection Time: 04/11/17  5:49 PM  Result Value Ref Range Status   Specimen Description BLOOD RFOA  Final   Special Requests   Final    BOTTLES DRAWN AEROBIC AND ANAEROBIC Blood Culture adequate volume   Culture NO GROWTH 5 DAYS  Final   Report Status 04/16/2017 FINAL  Final  Urine culture     Status: Abnormal   Collection Time: 04/11/17  8:23 PM  Result Value Ref Range Status   Specimen Description URINE, CLEAN CATCH  Final   Special Requests NONE  Final   Culture MULTIPLE SPECIES PRESENT, SUGGEST RECOLLECTION (A)  Final   Report Status 04/13/2017 FINAL  Final    Coagulation Studies: No results for input(s): LABPROT, INR in the last 72 hours.  Urinalysis: Recent Labs    09/16/18 1224  COLORURINE YELLOW*  LABSPEC 1.010  PHURINE 5.0  GLUCOSEU NEGATIVE  HGBUR MODERATE*  BILIRUBINUR NEGATIVE  KETONESUR NEGATIVE  PROTEINUR 100*  NITRITE NEGATIVE  LEUKOCYTESUR LARGE*      Imaging: US Renal  Result Date: 09/16/2018 CLINICAL DATA:  Acute renal failure EXAM: RENAL / URINARY TRACT ULTRASOUND COMPLETE COMPARISON:  CT abdomen pelvis 06/12/2016. Renal  ultrasound 03/13/2016 FINDINGS: Right Kidney: Renal measurements: 9.7 x 4.6 x 4.2 cm = volume: 97 mL. Right renal length 11 cm on prior ultrasound. Increased echogenicity right kidney without hydronephrosis Left Kidney: Renal measurements: 9.1 x 3.4 x 4.6 cm = volume: 74 mL. Left renal length 11.8 cm on prior ultrasound increased echogenicity left renal cortex. Negative for hydronephrosis Bladder: Appears normal for degree of bladder distention. IMPRESSION: Small echogenic kidneys compatible with chronic renal failure. Kidneys are significantly smaller compared with prior ultrasound of 2017. No renal obstruction. Electronically Signed   By: Franchot Gallo M.D.   On: 09/16/2018 14:25   Dg Hip Unilat W Or Wo Pelvis 2-3 Views Right  Result Date: 09/15/2018 CLINICAL DATA:  Right hip pain.  No known injury. EXAM: DG HIP (WITH OR WITHOUT PELVIS) 2-3V RIGHT COMPARISON:  None. FINDINGS: There is no evidence of hip fracture or dislocation. There is no evidence of arthropathy or other focal bone abnormality. IMPRESSION: Negative. Electronically Signed   By: Rolm Baptise M.D.   On: 09/15/2018 22:41     Medications:   . cefTRIAXone (ROCEPHIN)  IV 1 g (09/16/18 1607)   . amLODipine  10 mg Oral Daily  . aspirin EC  81 mg Oral Daily  . atorvastatin  80 mg Oral Daily  . carvedilol  12.5 mg Oral BID  . docusate sodium  100 mg Oral BID  . heparin  5,000 Units Subcutaneous Q8H  . hydrALAZINE  25 mg Oral TID  . insulin aspart  0-5 Units Subcutaneous QHS  . insulin aspart  0-9 Units Subcutaneous TID WC  . insulin detemir  3 Units Subcutaneous BID  . Melatonin  2.5 mg Oral QHS  . pantoprazole  40 mg Oral Daily  . predniSONE  50 mg Oral Q breakfast  . sevelamer carbonate  800 mg Oral TID WC   acetaminophen **OR** acetaminophen, hydrALAZINE, ondansetron **OR** ondansetron (ZOFRAN) IV, oxyCODONE  Assessment/ Plan:  [name] is a 57 y.o. black male with diabetes mellitus type II, hypertension, seizure  disorder, hyperlipidemia, history of subarachnoid hemorrhage  1. Acute renal failure Chronic kidney disease stage V: with metabolic acidosis. Baseline creatinine of 5.09, GFR of 13 on 06/12/18.  Chronic kidney disease secondary to diabetic nephropathy. Previously required hemodialysis for acute renal failure ~2 months before recovery of function.  Patient most likely has progression of chronic kidney disease and now with symptoms.  Recommend initiating hemodialysis. However patient is refusing. Have discussed case with patient's outpatient nephrologist at [facility].  Patient expresses understanding and risk of not initiating dialysis at this time.   2. Hypertension: well controlled.  -  amlodipine, carvedilol and hydralazine.   3. Diabetes mellitus type II with chronic kidney disease: hemoglobin A1c of 5.9% on 06/12/18.   4. Anemia of chronic kidney disease: hemoglobin 11.1 - no indication to start ESA therapy  5. Secondary Hyperparathyroidism: has been prescribed sevelamer in the past. Calcium and phosphorus at goal.  PTH on 06/12/18 elevated at 303.8.    LOS: 0 Willye Javier 11/19/201912:58 PM

## 2018-09-17 NOTE — Progress Notes (Signed)
Called Dr. Marcille Blanco regarding diet order per patient request.  Patient is not getting dialysis or HD access and would like to get his diet changed from NPO to carb mod/renal with fluid restrictions.  Appropriate orders were placed.  Christene Slates  09/17/2018  6:34 AM

## 2018-09-17 NOTE — Progress Notes (Signed)
[facility]   Communication Note At this time, the patient is refusing dialysis access placement as well as dialysis. Will include our practice information in his discharge section if he changes his mind in the future. Vascular will sign off at this time, if the patient changes his mind and would like access in order to dialyze please re-consult.  Thank you.   Marcelle Overlie PA-C 09/17/2018 12:24 PM

## 2018-09-17 NOTE — Progress Notes (Signed)
[facility] [place] NAME: Caleb Hughes    MR#:  [id number]  DATE OF BIRTH:  05-26-1961  SUBJECTIVE:  CHIEF COMPLAINT:    REVIEW OF SYSTEMS:  CONSTITUTIONAL: No fever, fatigue or weakness.  EYES: No blurred or double vision.  EARS, NOSE, AND THROAT: No tinnitus or ear pain.  RESPIRATORY: No cough, shortness of breath, wheezing or hemoptysis.  CARDIOVASCULAR: No chest pain, orthopnea, edema.  GASTROINTESTINAL: No nausea, vomiting, diarrhea or abdominal pain.  GENITOURINARY: No dysuria, hematuria.  ENDOCRINE: No polyuria, nocturia,  HEMATOLOGY: No anemia, easy bruising or bleeding SKIN: No rash or lesion. MUSCULOSKELETAL: No joint pain or arthritis.   NEUROLOGIC: No tingling, numbness, weakness.  PSYCHIATRY: No anxiety or depression.   DRUG ALLERGIES:  No Known Allergies  VITALS:  Blood pressure 119/68, pulse 80, temperature 98.5 F (36.9 C), temperature source Oral, resp. rate 16, height 6\' 2"  (1.88 m), weight 111.9 kg, SpO2 96 %.  PHYSICAL EXAMINATION:  GENERAL:  57 y.o.-year-old patient lying in the bed with no acute distress.  EYES: Pupils equal, round, reactive to light and accommodation. No scleral icterus. Extraocular muscles intact.  HEENT: Head atraumatic, normocephalic. Oropharynx and nasopharynx clear.  NECK:  Supple, no jugular venous distention. No thyroid enlargement, no tenderness.  LUNGS: Normal breath sounds bilaterally, no wheezing, rales,rhonchi or crepitation. No use of accessory muscles of respiration.  CARDIOVASCULAR: S1, S2 normal. No murmurs, rubs, or gallops.  ABDOMEN: Soft, nontender, nondistended. Bowel sounds present. No organomegaly or mass.  EXTREMITIES: No pedal edema, cyanosis, or clubbing.  NEUROLOGIC: Cranial nerves II through XII are intact. Muscle strength 5/5 in all extremities. Sensation intact. Gait not checked.  PSYCHIATRIC: The patient is alert and oriented x 3.  SKIN: No obvious rash,  lesion, or ulcer.    LABORATORY PANEL:   CBC Recent Labs  Lab 09/16/18 0546  WBC 13.5*  HGB 11.1*  HCT 35.5*  PLT 200   ------------------------------------------------------------------------------------------------------------------  Chemistries  Recent Labs  Lab 09/17/18 0358  NA 142  K 4.8  CL 112*  CO2 19*  GLUCOSE 154*  BUN 80*  CREATININE 6.35*  CALCIUM 9.2   ------------------------------------------------------------------------------------------------------------------  Cardiac Enzymes No results for input(s): TROPONINI in the last 168 hours. ------------------------------------------------------------------------------------------------------------------  RADIOLOGY:  US Renal  Result Date: 09/16/2018 CLINICAL DATA:  Acute renal failure EXAM: RENAL / URINARY TRACT ULTRASOUND COMPLETE COMPARISON:  CT abdomen pelvis 06/12/2016. Renal ultrasound 03/13/2016 FINDINGS: Right Kidney: Renal measurements: 9.7 x 4.6 x 4.2 cm = volume: 97 mL. Right renal length 11 cm on prior ultrasound. Increased echogenicity right kidney without hydronephrosis Left Kidney: Renal measurements: 9.1 x 3.4 x 4.6 cm = volume: 74 mL. Left renal length 11.8 cm on prior ultrasound increased echogenicity left renal cortex. Negative for hydronephrosis Bladder: Appears normal for degree of bladder distention. IMPRESSION: Small echogenic kidneys compatible with chronic renal failure. Kidneys are significantly smaller compared with prior ultrasound of 2017. No renal obstruction. Electronically Signed   By: Franchot Gallo M.D.   On: 09/16/2018 14:25   Dg Hip Unilat W Or Wo Pelvis 2-3 Views Right  Result Date: 09/15/2018 CLINICAL DATA:  Right hip pain.  No known injury. EXAM: DG HIP (WITH OR WITHOUT PELVIS) 2-3V RIGHT COMPARISON:  None. FINDINGS: There is no evidence of hip fracture or dislocation. There is no evidence of arthropathy or other focal bone abnormality. IMPRESSION: Negative. Electronically  Signed   By: Rolm Baptise M.D.   On: 09/15/2018 22:41  EKG:   Orders placed or performed during the hospital encounter of 04/11/17  . ED EKG 12-Lead  . ED EKG 12-Lead  . EKG 12-Lead  . EKG 12-Lead  . EKG    ASSESSMENT AND PLAN:    Acute on chronic renal failure 5(HCC) metabolic acidosis Creatinine 5.09 with a GFR 13 on June 12, 2018 -his increase in creatinine does not represent a significant drop in his filtration rate, though he has end-stage renal disease and has not been initiated on dialysis.  sees [facility] nephrology [facility] Dr. Juleen China is following during hospital course Seen by vascular for vascular access of dialysis but patient is refusing dialysis access placement and dialysis     Acute gout -colchicine given in the ED Patient was febrile last night presumed to be from the gout flareup continue close monitoring for the next 24 hours We will do pancultures if the patient spikes temperature again Seen by rheumatology Dr. Jefm Bryant status post right knee aspiration-inflammatory fluid 40 cc extracted Pain management as needed.  Recommending allopurinol 50 mg every other day and pain management as needed Prednisone    Type II diabetes mellitus with manifestations (HCC) -carb modified diet with sliding scale insulin coverage    Hypertension -currently normotensive, hold home antihypertensives for now, can reorder these if his blood pressure rises significantly     GERD (gastroesophageal reflux disease) - PPI    All the records are reviewed and case discussed with Care Management/Social Workerr. Management plans discussed with the patient, family and they are in agreement.  CODE STATUS: fc   TOTAL TIME TAKING CARE OF THIS PATIENT: 35  minutes.   POSSIBLE D/C IN 1-2  DAYS, DEPENDING ON CLINICAL CONDITION.  Note: This dictation was prepared with Dragon dictation along with smaller phrase technology. Any transcriptional errors that result from this process  are unintentional.   Nicholes Mango M.D on 09/17/2018 at 2:31 PM  Between 7am to 6pm - Pager - [phone] After 6pm go to [email or web address] - password [email or web address] [facility]  [facility]  Office  [phone]  CC: Primary care physician; [facility], [place]

## 2018-09-17 NOTE — Consult Note (Signed)
Rheumatology follow-up Knees are still mildly painful but improved from yesterday.  Able to sit in his chair.  Cell count 28,000.  Positive monosodium urate crystals  Exam: No significant knee effusions.  Mild decreased range of motion both ankles.  Afebrile  Impression:  Polyarticular gout.  hyperuricemia. End-stage renal disease  Recommend.  Prednisone taper.  Allopurinol 50 mg every other day if he goes on dialysis.  Agree with PT

## 2018-09-18 LAB — GLUCOSE, CAPILLARY
Glucose-Capillary: 146 mg/dL — ABNORMAL HIGH (ref 70–99)
Glucose-Capillary: 196 mg/dL — ABNORMAL HIGH (ref 70–99)

## 2018-09-18 MED ORDER — CEFDINIR 300 MG PO CAPS: 300.0000 mg | ORAL_CAPSULE | Freq: Two times a day (BID) | ORAL | 0 refills | Status: DC

## 2018-09-18 MED ORDER — HYDRALAZINE HCL 25 MG PO TABS: 25.0000 mg | ORAL_TABLET | Freq: Three times a day (TID) | ORAL | 0 refills | Status: AC

## 2018-09-18 MED ORDER — SODIUM BICARBONATE 650 MG PO TABS: 1300.0000 mg | ORAL_TABLET | Freq: Two times a day (BID) | ORAL | 0 refills | Status: AC

## 2018-09-18 MED ORDER — CEFDINIR 300 MG PO CAPS: 300.0000 mg | ORAL_CAPSULE | Freq: Every day | ORAL | 0 refills | Status: AC

## 2018-09-18 MED ORDER — ACETAMINOPHEN 325 MG PO TABS: 650.0000 mg | ORAL_TABLET | Freq: Four times a day (QID) | ORAL | Status: AC | PRN

## 2018-09-18 MED ORDER — ALLOPURINOL 100 MG PO TABS: 50.0000 mg | ORAL_TABLET | ORAL | 0 refills | Status: AC

## 2018-09-18 MED ORDER — CEFDINIR 300 MG PO CAPS
300.0000 mg | ORAL_CAPSULE | Freq: Two times a day (BID) | ORAL | Status: DC
  Filled 2018-09-18 (×2): qty 1

## 2018-09-18 MED ORDER — OXYCODONE HCL 5 MG PO TABS: 5.0000 mg | ORAL_TABLET | Freq: Four times a day (QID) | ORAL | 0 refills | Status: AC | PRN

## 2018-09-18 MED ORDER — CEFDINIR 300 MG PO CAPS
300.0000 mg | ORAL_CAPSULE | Freq: Every day | ORAL | Status: DC
  Administered 2018-09-18: 300 mg via ORAL
  Filled 2018-09-18: qty 1

## 2018-09-18 MED ORDER — SODIUM BICARBONATE 650 MG PO TABS
1300.0000 mg | ORAL_TABLET | Freq: Two times a day (BID) | ORAL | Status: DC
  Administered 2018-09-18: 1300 mg via ORAL
  Filled 2018-09-18: qty 2

## 2018-09-18 MED ORDER — PREDNISONE 10 MG (21) PO TBPK: 10.0000 mg | ORAL_TABLET | Freq: Every day | ORAL | 0 refills | Status: AC

## 2018-09-18 NOTE — Discharge Summary (Addendum)
[place] [facility] [place] NAME: Caleb Hughes    MR#:  [id number]  DATE OF BIRTH:  Jul 18, 1961  DATE OF ADMISSION:  09/15/2018 ADMITTING PHYSICIAN: Lance Coon, MD  DATE OF DISCHARGE: 09/18/18  PRIMARY CARE PHYSICIAN: [facility]    ADMISSION DIAGNOSIS:  Acute renal failure, unspecified acute renal failure type ([place]) [N17.9]  DISCHARGE DIAGNOSIS:  Principal Problem:   Acute on chronic renal failure (HCC) Active Problems:   Type II diabetes mellitus with manifestations (HCC)   Hypertension   Hyperlipemia   Gout   GERD (gastroesophageal reflux disease)   SECONDARY DIAGNOSIS:   Past Medical History:  Diagnosis Date  . Diabetes mellitus without complication ([place])   . Gout   . Hyperlipemia   . Hypertension   . Morbid obesity [facility])     HOSPITAL COURSE:  HPI  Caleb Hughes  is a 57 y.o. male who presents with chief complaint as above.  Patient presents to the ED with a complaint of right knee pain.  He states that he has had gout in that knee previously and that this feels similar to that.  On evaluation here in the ED he was found to have acute on chronic renal failure, and he has end-stage renal disease.  He follows with nephrology but has not been initiated on dialysis yet.  Hospitalist were called for admission  Acute on chronic renal failure 5(HCC) metabolic acidosis now at end-stage renal disease refusing hemodialysis Creatinine 5.09 with a GFR 13 on June 12, 2018 -his increase in creatinine does not represent a significant drop in his filtration rate, though he has end-stage renal disease and has not been initiated on dialysis.  sees [facility]. Juleen China is following during hospital course, creatinine is getting worse but patient is not ready for hemodialysis.  Creatinine at 6.35 today he prefers seeing his primary nephrologist at [facility] has an appointment around thanks giving time.  He  understands that his renal function is getting worse and he needs dialysis as soon as possible Seen by vascular for vascular access of dialysis but patient is refusing dialysis access placement and dialysis   Acute gout -polyarticular gout with hyperuricemia-colchicine given in the ED Patient was febrile 09/16/2018 night presumed to be from the gout flareup continue.  No other episodes of fever  Seen by rheumatology Dr. Jefm Bryant status post right knee aspiration-inflammatory fluid 40 cc extracted-fluid with positive monosodium urate crystals. Pain management as needed.  Recommending allopurinol 50 mg every other day and pain management as needed Prednisone  UTI -urine cx with ecoli , rxed with rocephin , dc home with omnicef 300 mg once daily, renal dose adjusted for 5 days. pcp to f/w final cx results  Type II diabetes mellitus with manifestations ([place]) -carb modified diet with sliding scale insulin coverage  Hypertension -currently normotensive, hold home antihypertensives for now, can reorder theseifhis blood pressure rises significantly   GERD (gastroesophageal reflux disease) - PPI  Generalized weakness with right knee pain PT consult-recommending skilled nursing facility but patient is refusing.  He prefers going home with home health will arrange the same.  Benign paroxysmal positional vertigo-meclizine as needed and outpatient follow-up with the balance clinic and ENT DISCHARGE CONDITIONS:   stable  CONSULTS OBTAINED:  Treatment Team:  Lavonia Dana, MD   PROCEDURES right knee aspiration  DRUG ALLERGIES:  No Known Allergies  DISCHARGE MEDICATIONS:   Allergies as of 09/18/2018   No Known Allergies  Medication List    STOP taking these medications   atorvastatin 80 MG tablet Commonly known as:  LIPITOR   cloNIDine 0.1 MG tablet Commonly known as:  CATAPRES   levETIRAcetam 500 MG tablet Commonly known as:  KEPPRA     TAKE these  medications   acetaminophen 325 MG tablet Commonly known as:  TYLENOL Take 2 tablets (650 mg total) by mouth every 6 (six) hours as needed for mild pain (or Fever >/= 101).   allopurinol 100 MG tablet Commonly known as:  ZYLOPRIM Take 0.5 tablets (50 mg total) by mouth every other day.   amLODipine 10 MG tablet Commonly known as:  NORVASC Take 10 mg by mouth daily.   aspirin EC 81 MG tablet Take 81 mg by mouth daily.   carvedilol 12.5 MG tablet Commonly known as:  COREG Take 12.5 mg by mouth 2 (two) times daily.   cefdinir 300 MG capsule Commonly known as:  OMNICEF Take 1 capsule (300 mg total) by mouth daily for 5 days. Start taking on:  09/19/2018   famotidine 20 MG tablet Commonly known as:  PEPCID Take 20 mg by mouth daily.   hydrALAZINE 25 MG tablet Commonly known as:  APRESOLINE Take 1 tablet (25 mg total) by mouth 3 (three) times daily.   insulin NPH Human 100 UNIT/ML injection Commonly known as:  HUMULIN N,NOVOLIN N Inject 8 Units into the skin 2 (two) times daily with a meal.   Melatonin 3 MG Tabs Take 1 tablet by mouth daily.   oxyCODONE 5 MG immediate release tablet Commonly known as:  Oxy IR/ROXICODONE Take 1 tablet (5 mg total) by mouth every 6 (six) hours as needed for moderate pain or severe pain.   pantoprazole 40 MG tablet Commonly known as:  PROTONIX Take 40 mg by mouth daily.   polyethylene glycol packet Commonly known as:  MIRALAX / GLYCOLAX Take 17 g by mouth daily.   predniSONE 10 MG (21) Tbpk tablet Commonly known as:  STERAPRED UNI-PAK 21 TAB Take 1 tablet (10 mg total) by mouth daily. Take 6 tablets by mouth for 1 day followed by  5 tablets by mouth for 1 day followed by  4 tablets by mouth for 1 day followed by  3 tablets by mouth for 1 day followed by  2 tablets by mouth for 1 day followed by  1 tablet by mouth for a day and stop   RENVELA 800 MG tablet Generic drug:  sevelamer carbonate Take 1 tablet by mouth 3 (three) times  daily.   senna 8.6 MG Tabs tablet Commonly known as:  SENOKOT Take 1 tablet by mouth at bedtime.   simvastatin 20 MG tablet Commonly known as:  ZOCOR Take 1 tablet by mouth daily.   sodium bicarbonate 650 MG tablet Take 2 tablets (1,300 mg total) by mouth 2 (two) times daily.        DISCHARGE INSTRUCTIONS:   Follow-up with primary care physician in 4 to 5 days Outpatient follow-up with [facility] nephrology as soon as possible or as scheduled in 1 week around Thanksgiving time as reported by the patient Follow-up with ENT for vertigo and balance clinic Home health PT  DIET:  Renal diet  DISCHARGE CONDITION:  Fair  ACTIVITY:  Activity as tolerated  OXYGEN:  Home Oxygen: No.   Oxygen Delivery: room air  DISCHARGE LOCATION:  home   If you experience worsening of your admission symptoms, develop shortness of breath, life threatening emergency, suicidal or homicidal  thoughts you must seek medical attention immediately by calling 911 or calling your MD immediately  if symptoms less severe.  You Must read complete instructions/literature along with all the possible adverse reactions/side effects for all the Medicines you take and that have been prescribed to you. Take any new Medicines after you have completely understood and accpet all the possible adverse reactions/side effects.   Please note  You were cared for by a hospitalist during your hospital stay. If you have any questions about your discharge medications or the care you received while you were in the hospital after you are discharged, you can call the unit and asked to speak with the hospitalist on call if the hospitalist that took care of you is not available. Once you are discharged, your primary care physician will handle any further medical issues. Please note that NO REFILLS for any discharge medications will be authorized once you are discharged, as it is imperative that you return to your primary care physician (or  establish a relationship with a primary care physician if you do not have one) for your aftercare needs so that they can reassess your need for medications and monitor your lab values.     Today  Chief Complaint  Patient presents with  . Leg Pain    Pt is doing ok, refusing HD,SNF , wants to go home ROS:  CONSTITUTIONAL: Denies fevers, chills. Denies any fatigue, weakness.  EYES: Denies blurry vision, double vision, eye pain. EARS, NOSE, THROAT: Denies tinnitus, ear pain, hearing loss. RESPIRATORY: Denies cough, wheeze, shortness of breath.  CARDIOVASCULAR: Denies chest pain, palpitations, edema.  GASTROINTESTINAL: Denies nausea, vomiting, diarrhea, abdominal pain. Denies bright red blood per rectum. GENITOURINARY: Denies dysuria, hematuria. ENDOCRINE: Denies nocturia or thyroid problems. HEMATOLOGIC AND LYMPHATIC: Denies easy bruising or bleeding. SKIN: Denies rash or lesion. MUSCULOSKELETAL: Denies pain in neck, back, shoulder, knees, hips or arthritic symptoms.  NEUROLOGIC: Denies paralysis, paresthesias.  PSYCHIATRIC: Denies anxiety or depressive symptoms.   VITAL SIGNS:  Blood pressure 130/71, pulse 67, temperature 98.4 F (36.9 C), resp. rate 18, height 6\' 2"  (1.88 m), weight 115.9 kg, SpO2 97 %.  I/O:    Intake/Output Summary (Last 24 hours) at 09/18/2018 1505 Last data filed at 09/18/2018 1300 Gross per 24 hour  Intake 804.57 ml  Output 1425 ml  Net -620.43 ml    PHYSICAL EXAMINATION:  GENERAL:  57 y.o.-year-old patient lying in the bed with no acute distress.  EYES: Pupils equal, round, reactive to light and accommodation. No scleral icterus. Extraocular muscles intact.  HEENT: Head atraumatic, normocephalic. Oropharynx and nasopharynx clear.  NECK:  Supple, no jugular venous distention. No thyroid enlargement, no tenderness.  LUNGS: Normal breath sounds bilaterally, no wheezing, rales,rhonchi or crepitation. No use of accessory muscles of respiration.   CARDIOVASCULAR: S1, S2 normal. No murmurs, rubs, or gallops.  ABDOMEN: Soft, non-tender, non-distended. Bowel sounds present. No organomegaly or mass.  EXTREMITIES: rt knee min tndernessNo pedal edema, cyanosis, or clubbing.  NEUROLOGIC: awake , alert ,oriented  Times 3 Sensation intact. Gait not checked.  PSYCHIATRIC: The patient is alert and oriented x 3.  SKIN: No obvious rash, lesion, or ulcer.   DATA REVIEW:   CBC Recent Labs  Lab 09/16/18 0546  WBC 13.5*  HGB 11.1*  HCT 35.5*  PLT 200    Chemistries  Recent Labs  Lab 09/17/18 0358  NA 142  K 4.8  CL 112*  CO2 19*  GLUCOSE 154*  BUN 80*  CREATININE 6.35*  CALCIUM 9.2    Cardiac Enzymes No results for input(s): TROPONINI in the last 168 hours.  Microbiology Results  Results for orders placed or performed during the hospital encounter of 09/15/18  Urine Culture     Status: Abnormal (Preliminary result)   Collection Time: 09/16/18 12:24 PM  Result Value Ref Range Status   Specimen Description   Final    URINE, RANDOM Performed at [facility], [address], [place], [place] [address]    Special Requests   Final    NONE Performed at [facility], [address] [place], [place] [address]    Culture >=100,000 COLONIES/mL ESCHERICHIA COLI (A)  Final   Report Status PENDING  Incomplete    RADIOLOGY:  US Renal  Result Date: 09/16/2018 CLINICAL DATA:  Acute renal failure EXAM: RENAL / URINARY TRACT ULTRASOUND COMPLETE COMPARISON:  CT abdomen pelvis 06/12/2016. Renal ultrasound 03/13/2016 FINDINGS: Right Kidney: Renal measurements: 9.7 x 4.6 x 4.2 cm = volume: 97 mL. Right renal length 11 cm on prior ultrasound. Increased echogenicity right kidney without hydronephrosis Left Kidney: Renal measurements: 9.1 x 3.4 x 4.6 cm = volume: 74 mL. Left renal length 11.8 cm on prior ultrasound increased echogenicity left renal cortex. Negative for hydronephrosis Bladder: Appears normal for degree of  bladder distention. IMPRESSION: Small echogenic kidneys compatible with chronic renal failure. Kidneys are significantly smaller compared with prior ultrasound of 2017. No renal obstruction. Electronically Signed   By: Franchot Gallo M.D.   On: 09/16/2018 14:25   Dg Hip Unilat W Or Wo Pelvis 2-3 Views Right  Result Date: 09/15/2018 CLINICAL DATA:  Right hip pain.  No known injury. EXAM: DG HIP (WITH OR WITHOUT PELVIS) 2-3V RIGHT COMPARISON:  None. FINDINGS: There is no evidence of hip fracture or dislocation. There is no evidence of arthropathy or other focal bone abnormality. IMPRESSION: Negative. Electronically Signed   By: Rolm Baptise M.D.   On: 09/15/2018 22:41    EKG:   Orders placed or performed during the hospital encounter of 04/11/17  . ED EKG 12-Lead  . ED EKG 12-Lead  . EKG 12-Lead  . EKG 12-Lead  . EKG      Management plans discussed with the patient, family and they are in agreement.  CODE STATUS:     Code Status Orders  (From admission, onward)         Start     Ordered   09/16/18 0338  Full code  Continuous     09/16/18 0337        Code Status History    Date Active Date Inactive Code Status Order ID Comments User Context   04/11/2017 2253 04/13/2017 1606 Full Code [id number]  Lance Coon, MD Inpatient   06/12/2016 2355 06/15/2016 1604 Full Code [id number]  Harvie Bridge, DO Inpatient   03/13/2016 1497 03/16/2016 1411 Full Code [id number]  Idelle Crouch, MD Inpatient      TOTAL TIME TAKING CARE OF THIS PATIENT: 43  minutes.   Note: This dictation was prepared with Dragon dictation along with smaller phrase technology. Any transcriptional errors that result from this process are unintentional.   @M[facility] @  on 09/18/2018 at 3:05 PM  Between 7am to 6pm - Pager - [phone]  After 6pm go to [email or web address] - password [email or web address] [facility]  [facility]  Office  [phone]  CC: Primary care physician; [facility] [place]

## 2018-09-18 NOTE — Progress Notes (Signed)
[facility]  ROUNDING NOTE   Subjective:   Less knee pain.   UOP 1450  Creatinine 6.35  Objective:  Vital signs in last 24 hours:  Temp:  [98.4 F (36.9 C)-98.5 F (36.9 C)] 98.4 F (36.9 C) (11/20 1401) Pulse Rate:  [66-70] 67 (11/20 1401) Resp:  [18-22] 18 (11/20 1401) BP: (116-130)/(71-75) 130/71 (11/20 1401) SpO2:  [95 %-99 %] 97 % (11/20 1401) Weight:  [115.9 kg] 115.9 kg (11/20 0508)  Weight change: 4 kg Filed Weights   09/16/18 0345 09/17/18 0441 09/18/18 0508  Weight: 114.4 kg 111.9 kg 115.9 kg    Intake/Output: I/O last 3 completed shifts: In: 684.6 [P.O.:480; I.V.:17.3; IV Piggyback:187.3] Out: 1950 [Urine:1950]   Intake/Output this shift:  Total I/O In: 480 [P.O.:480] Out: 475 [Urine:475]  Physical Exam: General: NAD,   Head: Normocephalic, atraumatic. Moist oral mucosal membranes  Eyes: Anicteric, PERRL  Neck: Supple, trachea midline  Lungs:  Clear to auscultation  Heart: Regular rate and rhythm  Abdomen:  Soft, nontender,   Extremities: No peripheral edema.  Neurologic: lethargic  Skin: No lesions  Access: none    Basic Metabolic Panel: Recent Labs  Lab 09/15/18 2220 09/16/18 0546 09/16/18 1153 09/17/18 0358  NA 144 144  --  142  K 4.7 4.5  --  4.8  CL 113* 113*  --  112*  CO2 19* 21*  --  19*  GLUCOSE 130* 127*  --  154*  BUN 71* 74*  --  80*  CREATININE 6.23* 6.07*  --  6.35*  CALCIUM 9.5 9.1  --  9.2  PHOS  --   --  3.3  --     Liver Function Tests: No results for input(s): AST, ALT, ALKPHOS, BILITOT, PROT, ALBUMIN in the last 168 hours. No results for input(s): LIPASE, AMYLASE in the last 168 hours. No results for input(s): AMMONIA in the last 168 hours.  CBC: Recent Labs  Lab 09/15/18 2220 09/16/18 0546  WBC 14.0* 13.5*  NEUTROABS 10.2*  --   HGB 12.1* 11.1*  HCT 38.9* 35.5*  MCV 79.2* 79.2*  PLT 189 200    Cardiac Enzymes: No results for input(s): CKTOTAL, CKMB, CKMBINDEX, TROPONINI in the last  168 hours.  BNP: Invalid input(s): POCBNP  CBG: Recent Labs  Lab 09/17/18 1206 09/17/18 1719 09/17/18 2126 09/18/18 0749 09/18/18 1137  GLUCAP 157* 158* 182* 146* 196*    Microbiology: Results for orders placed or performed during the hospital encounter of 09/15/18  Urine Culture     Status: Abnormal (Preliminary result)   Collection Time: 09/16/18 12:24 PM  Result Value Ref Range Status   Specimen Description   Final    URINE, RANDOM Performed at [facility], [address] [place], [place] [address]    Special Requests   Final    NONE Performed at [facility], [address], [place], [place] [address]    Culture >=100,000 COLONIES/mL ESCHERICHIA COLI (A)  Final   Report Status PENDING  Incomplete    Coagulation Studies: No results for input(s): LABPROT, INR in the last 72 hours.  Urinalysis: Recent Labs    09/16/18 1224  COLORURINE YELLOW*  LABSPEC 1.010  PHURINE 5.0  GLUCOSEU NEGATIVE  HGBUR MODERATE*  BILIRUBINUR NEGATIVE  KETONESUR NEGATIVE  PROTEINUR 100*  NITRITE NEGATIVE  LEUKOCYTESUR LARGE*      Imaging: No results found.   Medications:   . sodium chloride Stopped (09/17/18 1722)   . amLODipine  10 mg Oral  Daily  . aspirin EC  81 mg Oral Daily  . atorvastatin  80 mg Oral Daily  . carvedilol  12.5 mg Oral BID  . cefdinir  300 mg Oral Daily  . docusate sodium  100 mg Oral BID  . heparin  5,000 Units Subcutaneous Q8H  . hydrALAZINE  25 mg Oral TID  . insulin aspart  0-5 Units Subcutaneous QHS  . insulin aspart  0-9 Units Subcutaneous TID WC  . insulin detemir  3 Units Subcutaneous BID  . Melatonin  2.5 mg Oral QHS  . pantoprazole  40 mg Oral Daily  . predniSONE  50 mg Oral Q breakfast  . sevelamer carbonate  800 mg Oral TID WC  . sodium bicarbonate  1,300 mg Oral BID   sodium chloride, acetaminophen **OR** acetaminophen, hydrALAZINE, ondansetron **OR** ondansetron (ZOFRAN) IV, oxyCODONE  Assessment/ Plan:   [name] is a 57 y.o. black male with diabetes mellitus type II, hypertension, seizure disorder, hyperlipidemia, history of subarachnoid hemorrhage  1. Acute renal failure Chronic kidney disease stage V: with metabolic acidosis. Baseline creatinine of 5.09, GFR of 13 on 06/12/18.  With metabolic acidosis Chronic kidney disease secondary to diabetic nephropathy. Previously required hemodialysis for acute renal failure ~2 months before recovery of function.  Patient most likely has progression of chronic kidney disease and now with symptoms.  Recommend initiating hemodialysis. However patient is refusing. Have discussed case with patient's outpatient nephrologist at [facility].  Patient expresses understanding and risk of not initiating dialysis at this time.  Follow up with [facility] - start sodium bicarbonate PO  2. Hypertension: well controlled.  - amlodipine, carvedilol and hydralazine.   3. Diabetes mellitus type II with chronic kidney disease: hemoglobin A1c of 5.9% on 06/12/18.   4. Anemia of chronic kidney disease:  - no indication to start ESA therapy  5. Secondary Hyperparathyroidism: has been prescribed sevelamer in the past. Calcium and phosphorus at goal.  PTH on 06/12/18 elevated at 303.8.   6. Gout: status post bilateral arthrocentesis - agree with starting allopurinol 50mg  every other day.  - Appreciate rheumatology input.    LOS: 1 Caleb Hughes 11/20/20193:25 PM

## 2018-09-18 NOTE — Progress Notes (Signed)
Physical Therapy Treatment Patient Details Name: Caleb Hughes MRN: [id number] DOB: [birth year] Today's Date: 09/18/2018    History of Present Illness Pt is a 57 y/o M who presented with BLE pain (R worse than L).  Pt reports h/o gout in R knee previously.  Pending ortho surgery consult for potential steroid injection for potential gout.  Pt noted to be in acute on chronic renal failure.  Pt's PMH includes morbid obesity.      PT Comments    Caleb Hughes made good progress with mobility but does continue to demonstrate instability and BLE weakness with OOB activity.  He requires min guard assist for transfers and for short distance ambulation, fatiguing quickly.  As pt is refusing SNF recommendation and is planning on returning home at d/c, time was taken during today's session to discuss safety in the home as documented below.  Per chart, there was concern for possible BPPV as pt has been experiencing some dizziness during hospital stay.  This PT asked the pt questions at start of session to determine need for vertigo testing; however, pt's dizziness has improved and resolved and sounds like it was due to decreased mobility and time spent in bed (see general comments below for more details).     Follow Up Recommendations  SNF(HHPT with OOB assist if pt refuses SNF recommendation)     Equipment Recommendations  Rolling walker with 5" wheels    Recommendations for Other Services       Precautions / Restrictions Precautions Precautions: Fall Restrictions Weight Bearing Restrictions: No    Mobility  Bed Mobility               General bed mobility comments: Pt sitting in chair at start and end of session  Transfers Overall transfer level: Needs assistance Equipment used: Rolling walker (2 wheeled) Transfers: Sit to/from Stand Sit to Stand: Min guard         General transfer comment: Pt with safe and proper technique with sit<>stand this session.  Min guard provided for  safety.   Ambulation/Gait Ambulation/Gait assistance: Min guard Gait Distance (Feet): 25 Feet Assistive device: Rolling walker (2 wheeled) Gait Pattern/deviations: Step-through pattern;Decreased step length - right;Decreased step length - left;Trunk flexed Gait velocity: decreased   General Gait Details: Cues for upright posture and forward gaze.  Min guard for safety.  Pt fatigues quickly.    Stairs             Wheelchair Mobility    Modified Rankin (Stroke Patients Only)       Balance Overall balance assessment: Needs assistance Sitting-balance support: No upper extremity supported;Feet supported Sitting balance-Leahy Scale: Good     Standing balance support: Bilateral upper extremity supported;During functional activity Standing balance-Leahy Scale: Poor Standing balance comment: Pt relies on BUE support for static and dynamic activities                            Cognition Arousal/Alertness: Awake/alert Behavior During Therapy: Flat affect(but pleasant) Overall Cognitive Status: Within Functional Limits for tasks assessed                                        Exercises Other Exercises Other Exercises: Educated pt on safety in the home with walking (with supervision), tripping hazards, etc.     General Comments General comments (skin integrity, edema, etc.):  Pt denies dizziness throughout session today.  Vitals monitored during session.  In sitting: 120/71 BP, pulse 66.  Standing: 121/72 BP, SpO2 98%, pules 69.  Ambulation: pulse 71, SpO2 93%.  Sitting BP 130/71, SpO2 97%, pulse 66.   Spoke with pt about the dizziness he has reported during his hospital stay.  Pt reports this is new for him and started with first bed mobility during hospital.  He denies symptoms prior to hospitalization.  Pt not currently experiencing dizziness sitting in chair but did experience some with sit>stand this morning.  Pt denies any recent upper respiratory  infection, head trauma, or dizziness with head turning/rolling over in bed.  Given this subjective information, it sounds more like positioning dizziness related to decreased activity level.  Orthostatics have been negative during previous 2 PT sessions and remain negative again this session.  Pt instructed to notify his MD if his symptoms do not improve or worsen once he leaves the hospital.  Pt verbalized understanding.       Pertinent Vitals/Pain Pain Assessment: Faces Faces Pain Scale: Hurts even more Pain Location: R knee with ambulation Pain Descriptors / Indicators: Aching;Grimacing;Guarding Pain Intervention(s): Limited activity within patient's tolerance;Monitored during session    Home Living                      Prior Function            PT Goals (current goals can now be found in the care plan section) Acute Rehab PT Goals PT Goal Formulation: With patient Time For Goal Achievement: 09/30/18 Potential to Achieve Goals: Good Progress towards PT goals: Progressing toward goals    Frequency    Min 2X/week      PT Plan Current plan remains appropriate    Co-evaluation              AM-PAC PT "6 Clicks" Daily Activity  Outcome Measure  Difficulty turning over in bed (including adjusting bedclothes, sheets and blankets)?: A Little Difficulty moving from lying on back to sitting on the side of the bed? : A Little Difficulty sitting down on and standing up from a chair with arms (e.g., wheelchair, bedside commode, etc,.)?: Unable Help needed moving to and from a bed to chair (including a wheelchair)?: A Little Help needed walking in hospital room?: A Little Help needed climbing 3-5 steps with a railing? : Total 6 Click Score: 14    End of Session Equipment Utilized During Treatment: Gait belt Activity Tolerance: Patient tolerated treatment well Patient left: with call bell/phone within reach;in chair;with chair alarm set Nurse Communication:  Mobility status PT Visit Diagnosis: Pain;Unsteadiness on feet (R26.81);Difficulty in walking, not elsewhere classified (R26.2) Pain - Right/Left: Right Pain - part of body: Knee     Time: 9450-3888 PT Time Calculation (min) (ACUTE ONLY): 25 min  Charges:  $Gait Training: 8-22 mins $Therapeutic Activity: 8-22 mins                     Session was performed by student PT, Belva Crome, and directed, overseen, and documented by this PT.  Caleb Hughes PT, DPT 09/18/2018, 2:26 PM

## 2018-09-18 NOTE — Discharge Instructions (Signed)
Follow-up with primary care physician in 4 to 5 days Outpatient follow-up with [facility] nephrology as soon as possible or as scheduled in 1 week around Thanksgiving time as reported by the patient Follow-up with ENT for vertigo and balance clinic Home health PT

## 2018-09-18 NOTE — Care Management Note (Signed)
Case Management Note  Patient Details  Name: Caleb Hughes MRN: [id number] Date of Birth: January 16, 1961   Patient to discharge home today.  Patient still declining SNF.  Corene Cornea with [facility] notified of discharge.   Subjective/Objective:                    Action/Plan:   Expected Discharge Date:  09/18/18               Expected Discharge Plan:  [place]  In-House Referral:     Discharge planning Services  CM Consult  Post Acute Care Choice:  Home Health Choice offered to:  Patient  DME Arranged:    DME Agency:     HH Arranged:  RN, PT, Nurse's Aide [place] Agency:  [facility]  Status of Service:  Completed, signed off  If discussed at [place] of Stay Meetings, dates discussed:    Additional Comments:  Beverly Sessions, RN 09/18/2018, 2:01 PM

## 2018-09-19 LAB — URINE CULTURE

## 2018-09-20 LAB — QUANTIFERON-TB GOLD PLUS (RQFGPL)
QUANTIFERON NIL VALUE: 0.02 [IU]/mL
QuantiFERON Mitogen Value: 3.57 IU/mL
QuantiFERON TB1 Ag Value: 0.02 IU/mL
QuantiFERON TB2 Ag Value: 0.02 IU/mL

## 2018-09-20 LAB — QUANTIFERON-TB GOLD PLUS: QUANTIFERON-TB GOLD PLUS: NEGATIVE

## 2019-06-25 IMAGING — US US RENAL
1 series · 14 of 25 positions shown · non-contrast
Comparison: CT abdomen pelvis 06/12/2016. Renal ultrasound
03/13/2016

CLINICAL DATA: Acute renal failure

EXAM:
RENAL / URINARY TRACT ULTRASOUND COMPLETE

[Series 1: us renal · 14 of 49 slices shown]
[im 1/49]
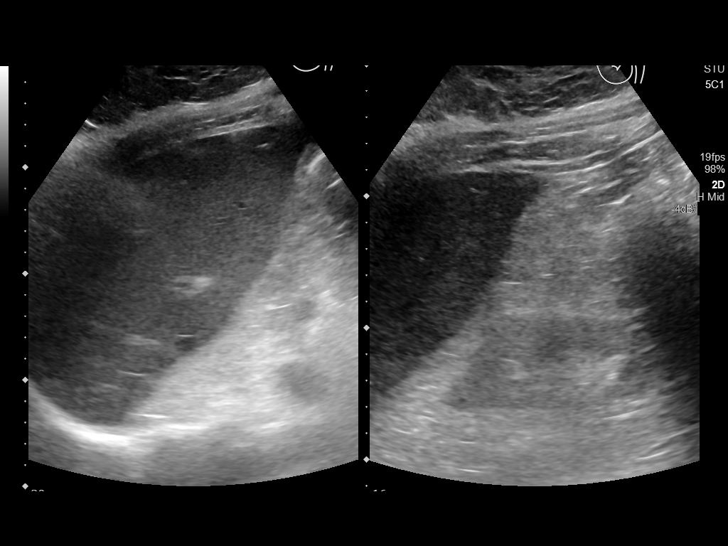
[im 5/49]
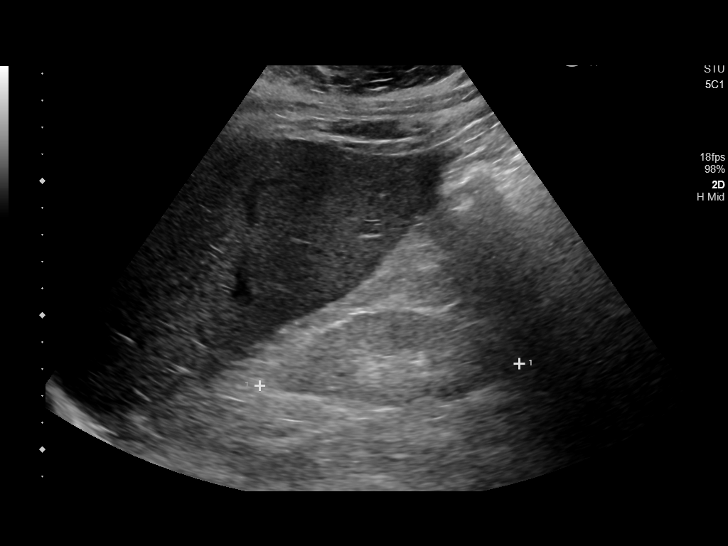
[im 9/49]
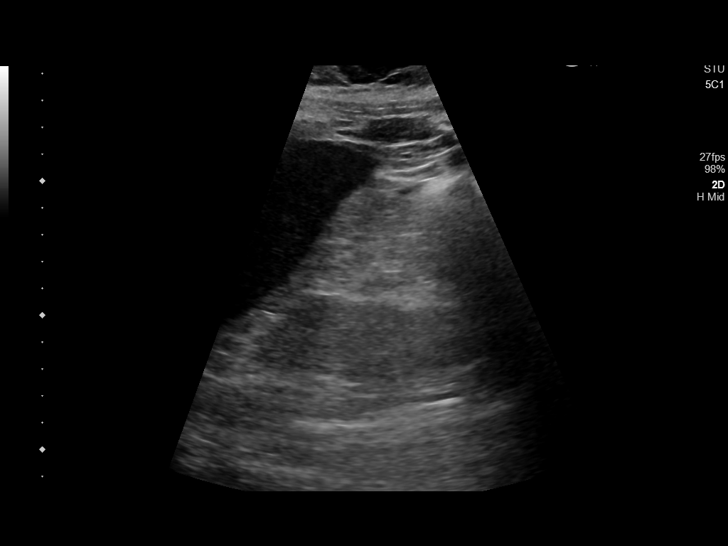
[im 13/49]
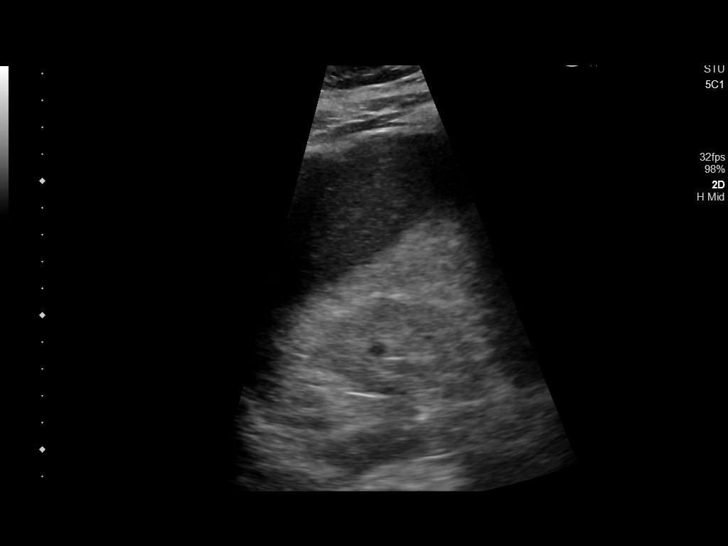
[im 17/49]
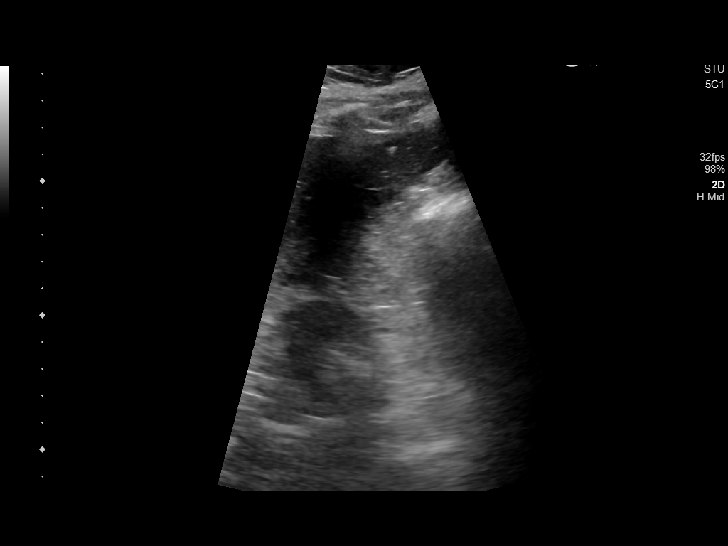
[im 19/49]
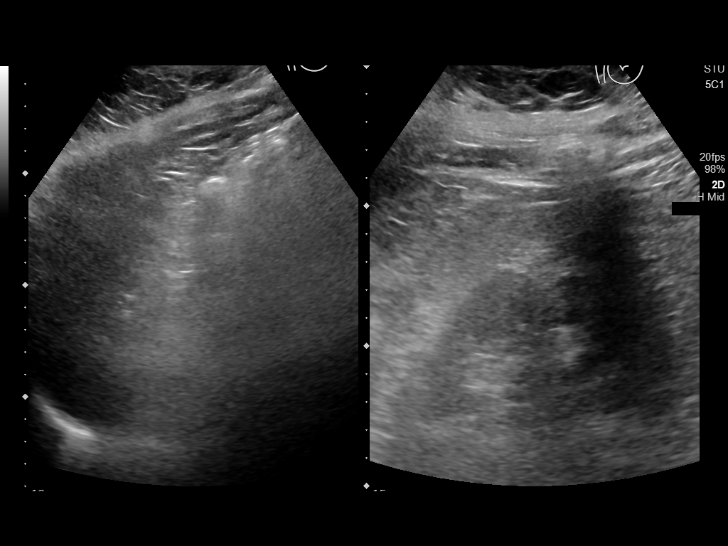
[im 23/49]
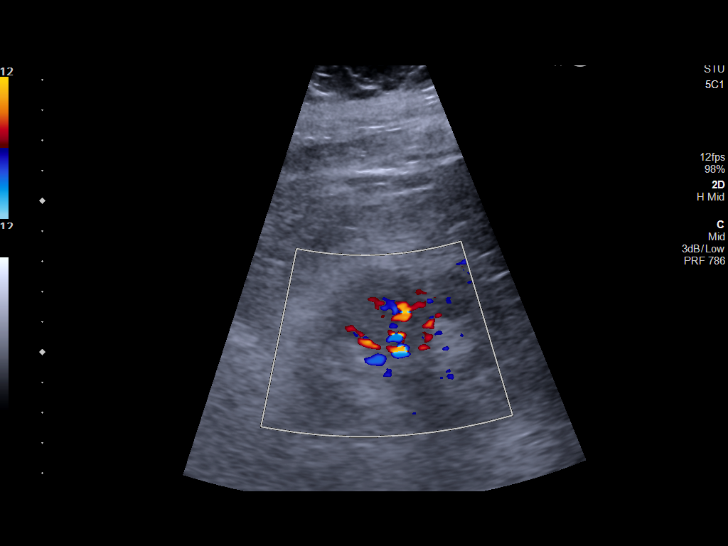
[im 27/49]
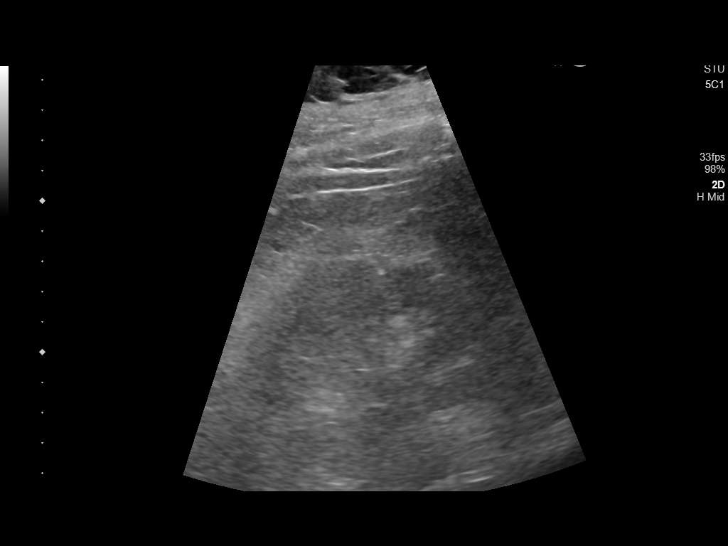
[im 31/49]
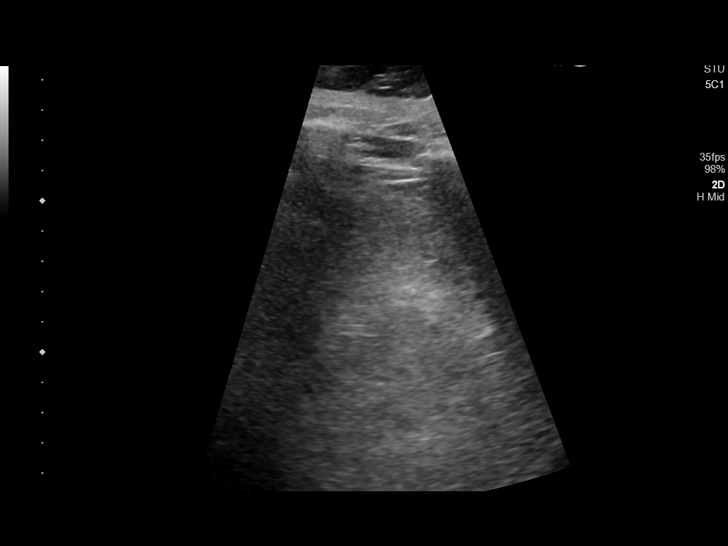
[im 33/49]
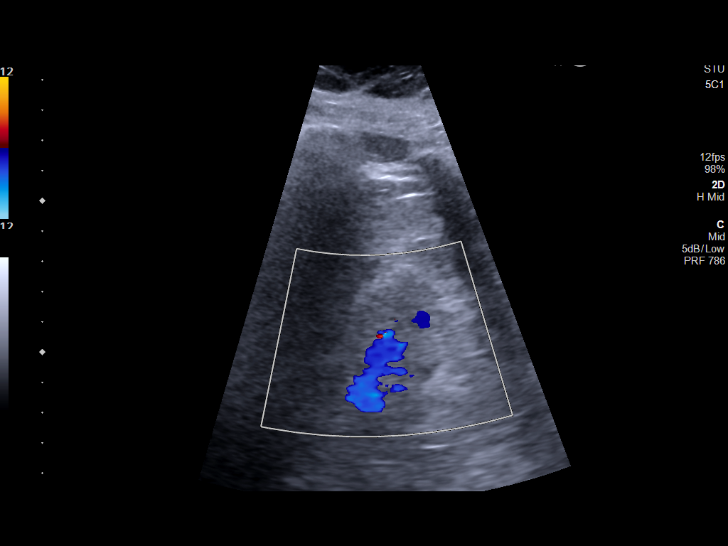
[im 37/49]
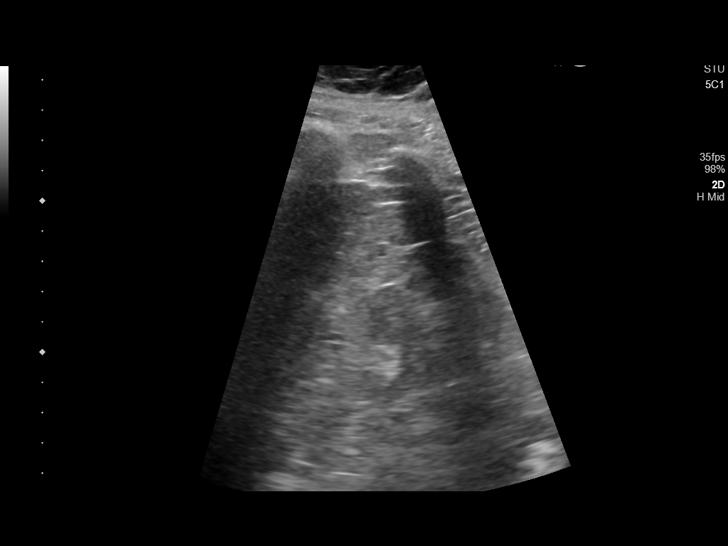
[im 41/49]
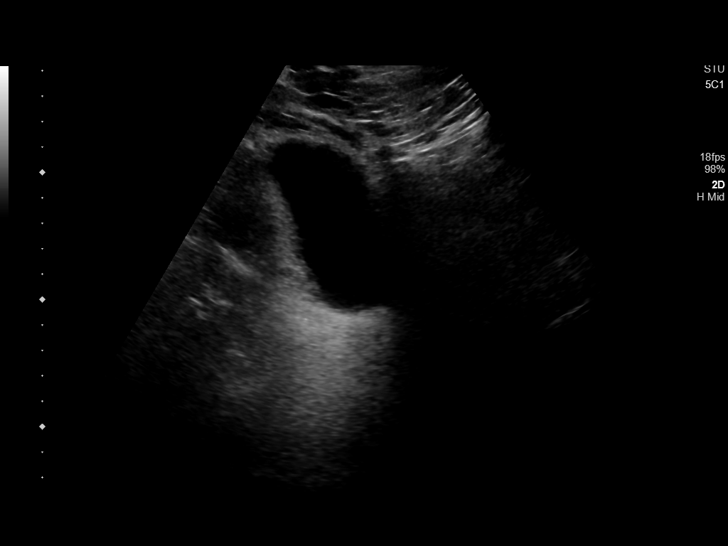
[im 45/49]
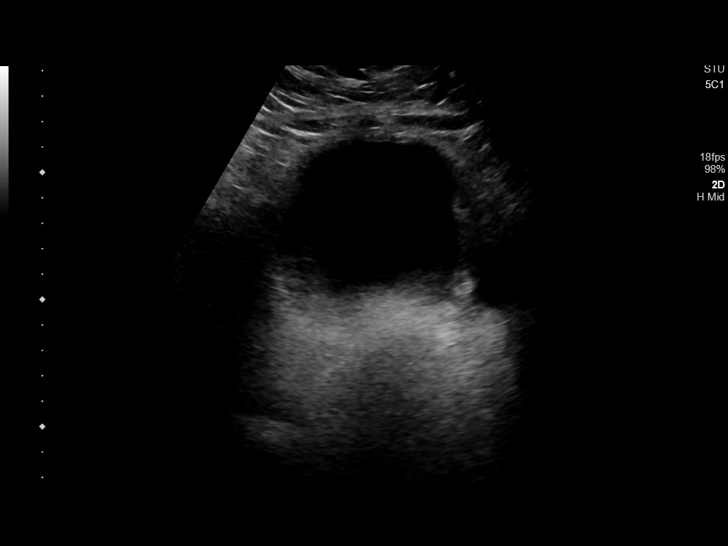
[im 49/49]
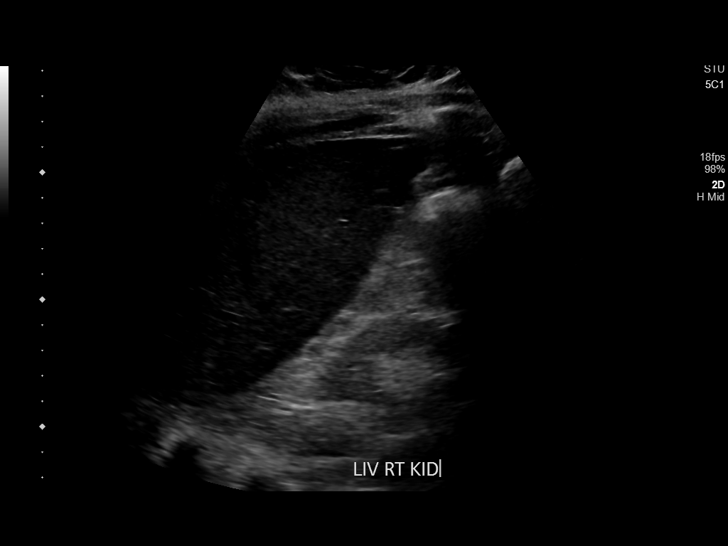

[14 of 25 positions shown; findings below may reference images not displayed]

FINDINGS: Right Kidney:

Renal measurements: 9.7 x 4.6 x 4.2 cm = volume: 97 mL. Right renal
length 11 cm on prior ultrasound. Increased echogenicity right
kidney without hydronephrosis

Left Kidney:

Renal measurements: 9.1 x 3.4 x 4.6 cm = volume: 74 mL. Left renal
length 11.8 cm on prior ultrasound increased echogenicity left renal
cortex. Negative for hydronephrosis

Bladder:

Appears normal for degree of bladder distention.
IMPRESSION: Small echogenic kidneys compatible with chronic renal failure.
Kidneys are significantly smaller compared with prior ultrasound of
8831. No renal obstruction.

## 2019-09-30 DEATH — deceased
